# Patient Record
Sex: Male | Born: 1950 | Race: Black or African American | Hispanic: No | Marital: Single | Smoking: Never smoker
Health system: Southern US, Community
[De-identification: ages and names within clinical notes are randomized; demographics above are authoritative.]

## PROBLEM LIST (undated history)

## (undated) DIAGNOSIS — E785 Hyperlipidemia, unspecified: Secondary | ICD-10-CM

## (undated) DIAGNOSIS — I4891 Unspecified atrial fibrillation: Secondary | ICD-10-CM

## (undated) DIAGNOSIS — R569 Unspecified convulsions: Secondary | ICD-10-CM

## (undated) DIAGNOSIS — I639 Cerebral infarction, unspecified: Secondary | ICD-10-CM

## (undated) DIAGNOSIS — I10 Essential (primary) hypertension: Secondary | ICD-10-CM

---

## 2015-09-25 ENCOUNTER — Emergency Department: Payer: Medicare Other

## 2015-09-25 ENCOUNTER — Inpatient Hospital Stay
Admission: EM | Admit: 2015-09-25 | Discharge: 2015-10-02 | DRG: 871 | Disposition: A | Discharge status: Skilled Nursing Facility | Payer: Medicare Other | Attending: Internal Medicine | Admitting: Internal Medicine

## 2015-09-25 DIAGNOSIS — N179 Acute kidney failure, unspecified: Secondary | ICD-10-CM | POA: Diagnosis present

## 2015-09-25 DIAGNOSIS — Y92239 Unspecified place in hospital as the place of occurrence of the external cause: Secondary | ICD-10-CM | POA: Diagnosis not present

## 2015-09-25 DIAGNOSIS — R471 Dysarthria and anarthria: Secondary | ICD-10-CM | POA: Diagnosis present

## 2015-09-25 DIAGNOSIS — E872 Acidosis: Secondary | ICD-10-CM | POA: Diagnosis present

## 2015-09-25 DIAGNOSIS — R1310 Dysphagia, unspecified: Secondary | ICD-10-CM | POA: Diagnosis present

## 2015-09-25 DIAGNOSIS — A419 Sepsis, unspecified organism: Secondary | ICD-10-CM | POA: Diagnosis present

## 2015-09-25 DIAGNOSIS — B370 Candidal stomatitis: Secondary | ICD-10-CM | POA: Diagnosis not present

## 2015-09-25 DIAGNOSIS — T3695XA Adverse effect of unspecified systemic antibiotic, initial encounter: Secondary | ICD-10-CM | POA: Diagnosis not present

## 2015-09-25 DIAGNOSIS — R5600 Simple febrile convulsions: Secondary | ICD-10-CM | POA: Diagnosis present

## 2015-09-25 DIAGNOSIS — I482 Chronic atrial fibrillation: Secondary | ICD-10-CM | POA: Diagnosis present

## 2015-09-25 DIAGNOSIS — I69391 Dysphagia following cerebral infarction: Secondary | ICD-10-CM

## 2015-09-25 DIAGNOSIS — Y833 Surgical operation with formation of external stoma as the cause of abnormal reaction of the patient, or of later complication, without mention of misadventure at the time of the procedure: Secondary | ICD-10-CM | POA: Diagnosis not present

## 2015-09-25 DIAGNOSIS — Y95 Nosocomial condition: Secondary | ICD-10-CM | POA: Diagnosis present

## 2015-09-25 DIAGNOSIS — Z8249 Family history of ischemic heart disease and other diseases of the circulatory system: Secondary | ICD-10-CM | POA: Diagnosis not present

## 2015-09-25 DIAGNOSIS — I69320 Aphasia following cerebral infarction: Secondary | ICD-10-CM

## 2015-09-25 DIAGNOSIS — Z993 Dependence on wheelchair: Secondary | ICD-10-CM | POA: Diagnosis not present

## 2015-09-25 DIAGNOSIS — F0390 Unspecified dementia without behavioral disturbance: Secondary | ICD-10-CM | POA: Diagnosis present

## 2015-09-25 DIAGNOSIS — Z7901 Long term (current) use of anticoagulants: Secondary | ICD-10-CM

## 2015-09-25 DIAGNOSIS — J9601 Acute respiratory failure with hypoxia: Secondary | ICD-10-CM | POA: Diagnosis present

## 2015-09-25 DIAGNOSIS — J690 Pneumonitis due to inhalation of food and vomit: Secondary | ICD-10-CM | POA: Diagnosis present

## 2015-09-25 DIAGNOSIS — R6520 Severe sepsis without septic shock: Secondary | ICD-10-CM

## 2015-09-25 DIAGNOSIS — R310 Gross hematuria: Secondary | ICD-10-CM | POA: Diagnosis present

## 2015-09-25 DIAGNOSIS — K9423 Gastrostomy malfunction: Secondary | ICD-10-CM | POA: Diagnosis not present

## 2015-09-25 DIAGNOSIS — I248 Other forms of acute ischemic heart disease: Secondary | ICD-10-CM | POA: Diagnosis present

## 2015-09-25 DIAGNOSIS — I69354 Hemiplegia and hemiparesis following cerebral infarction affecting left non-dominant side: Secondary | ICD-10-CM

## 2015-09-25 DIAGNOSIS — J9602 Acute respiratory failure with hypercapnia: Secondary | ICD-10-CM | POA: Diagnosis present

## 2015-09-25 DIAGNOSIS — I10 Essential (primary) hypertension: Secondary | ICD-10-CM | POA: Diagnosis present

## 2015-09-25 DIAGNOSIS — E162 Hypoglycemia, unspecified: Secondary | ICD-10-CM | POA: Diagnosis not present

## 2015-09-25 DIAGNOSIS — E785 Hyperlipidemia, unspecified: Secondary | ICD-10-CM | POA: Diagnosis present

## 2015-09-25 DIAGNOSIS — Z79899 Other long term (current) drug therapy: Secondary | ICD-10-CM | POA: Diagnosis not present

## 2015-09-25 DIAGNOSIS — K521 Toxic gastroenteritis and colitis: Secondary | ICD-10-CM | POA: Diagnosis not present

## 2015-09-25 DIAGNOSIS — R569 Unspecified convulsions: Secondary | ICD-10-CM

## 2015-09-25 HISTORY — DX: Essential (primary) hypertension: I10

## 2015-09-25 HISTORY — DX: Unspecified atrial fibrillation: I48.91

## 2015-09-25 HISTORY — DX: Hyperlipidemia, unspecified: E78.5

## 2015-09-25 HISTORY — DX: Cerebral infarction, unspecified: I63.9

## 2015-09-25 LAB — LACTIC ACID, PLASMA
Lactic Acid, Venous: 3.5 mmol/L (ref 0.5–2.0)
Lactic Acid, Venous: 2.3 mmol/L (ref 0.5–2.0)

## 2015-09-25 LAB — URINALYSIS COMPLETE WITH MICROSCOPIC (ARMC ONLY)
BILIRUBIN URINE: NEGATIVE
Bacteria, UA: NONE SEEN
Bacteria, UA: NONE SEEN
GLUCOSE, UA: NEGATIVE mg/dL
HGB URINE DIPSTICK: NEGATIVE
KETONES UR: NEGATIVE mg/dL
LEUKOCYTES UA: NEGATIVE
Nitrite: NEGATIVE
Protein, ur: 100 mg/dL — AB
SPECIFIC GRAVITY, URINE: 1.017 (ref 1.005–1.030)
SQUAMOUS EPITHELIAL / LPF: NONE SEEN
Specific Gravity, Urine: 1.018 (ref 1.005–1.030)
pH: 5 (ref 5.0–8.0)

## 2015-09-25 LAB — COMPREHENSIVE METABOLIC PANEL
ALT: 37 U/L (ref 17–63)
AST: 53 U/L — AB (ref 15–41)
Albumin: 4.2 g/dL (ref 3.5–5.0)
Alkaline Phosphatase: 81 U/L (ref 38–126)
Anion gap: 13 (ref 5–15)
BUN: 20 mg/dL (ref 6–20)
CHLORIDE: 99 mmol/L — AB (ref 101–111)
CO2: 24 mmol/L (ref 22–32)
Calcium: 9.8 mg/dL (ref 8.9–10.3)
Creatinine, Ser: 1.79 mg/dL — ABNORMAL HIGH (ref 0.61–1.24)
GFR, EST AFRICAN AMERICAN: 43 mL/min — AB (ref >60)
GFR, EST NON AFRICAN AMERICAN: 38 mL/min — AB (ref >60)
Glucose, Bld: 85 mg/dL (ref 65–99)
POTASSIUM: 4 mmol/L (ref 3.5–5.1)
SODIUM: 136 mmol/L (ref 135–145)
Total Bilirubin: 1.2 mg/dL (ref 0.3–1.2)
Total Protein: 8.1 g/dL (ref 6.5–8.1)

## 2015-09-25 LAB — TROPONIN I
TROPONIN I: 0.72 ng/mL — AB (ref <0.031)
Troponin I: 0.57 ng/mL — ABNORMAL HIGH (ref <0.031)
Troponin I: 0.46 ng/mL — ABNORMAL HIGH (ref <0.031)

## 2015-09-25 LAB — CBC WITH DIFFERENTIAL/PLATELET
BASOS ABS: 0 10*3/uL (ref 0–0.1)
Basophils Relative: 0 %
EOS ABS: 0 10*3/uL (ref 0–0.7)
HCT: 41 % (ref 40.0–52.0)
Hemoglobin: 13.6 g/dL (ref 13.0–18.0)
Lymphs Abs: 1.1 10*3/uL (ref 1.0–3.6)
MCH: 29.3 pg (ref 26.0–34.0)
MCHC: 33.3 g/dL (ref 32.0–36.0)
MCV: 88.2 fL (ref 80.0–100.0)
MONO ABS: 1.1 10*3/uL — AB (ref 0.2–1.0)
Monocytes Relative: 5 %
Neutro Abs: 18.2 10*3/uL — ABNORMAL HIGH (ref 1.4–6.5)
Neutrophils Relative %: 89 %
PLATELETS: 185 10*3/uL (ref 150–440)
RBC: 4.65 MIL/uL (ref 4.40–5.90)
RDW: 14.9 % — AB (ref 11.5–14.5)
WBC: 20.5 10*3/uL — AB (ref 3.8–10.6)

## 2015-09-25 LAB — GLUCOSE, CAPILLARY
GLUCOSE-CAPILLARY: 140 mg/dL — AB (ref 65–99)
GLUCOSE-CAPILLARY: 85 mg/dL (ref 65–99)
GLUCOSE-CAPILLARY: 102 mg/dL — AB (ref 65–99)
Glucose-Capillary: 86 mg/dL (ref 65–99)

## 2015-09-25 LAB — CBC
HEMATOCRIT: 42.7 % (ref 40.0–52.0)
Hemoglobin: 14.3 g/dL (ref 13.0–18.0)
MCH: 29.2 pg (ref 26.0–34.0)
MCHC: 33.4 g/dL (ref 32.0–36.0)
MCV: 87.4 fL (ref 80.0–100.0)
PLATELETS: 126 10*3/uL — AB (ref 150–440)
RBC: 4.89 MIL/uL (ref 4.40–5.90)
RDW: 15.4 % — AB (ref 11.5–14.5)
WBC: 7.2 10*3/uL (ref 3.8–10.6)

## 2015-09-25 LAB — BLOOD GAS, ARTERIAL
Acid-base deficit: 3.7 mmol/L — ABNORMAL HIGH (ref 0.0–2.0)
Allens test (pass/fail): POSITIVE — AB
BICARBONATE: 24.2 meq/L (ref 21.0–28.0)
FIO2: 1
O2 Saturation: 95.2 %
PH ART: 7.26 — AB (ref 7.350–7.450)
PO2 ART: 88 mmHg (ref 83.0–108.0)
Patient temperature: 37
pCO2 arterial: 54 mmHg — ABNORMAL HIGH (ref 32.0–48.0)

## 2015-09-25 LAB — APTT: aPTT: 45 seconds — ABNORMAL HIGH (ref 24–36)

## 2015-09-25 LAB — PROTIME-INR
INR: 1.9
Prothrombin Time: 21.7 seconds — ABNORMAL HIGH (ref 11.4–15.0)

## 2015-09-25 LAB — MRSA PCR SCREENING: MRSA by PCR: NEGATIVE

## 2015-09-25 LAB — PROCALCITONIN: Procalcitonin: 47.44 ng/mL

## 2015-09-25 MED ORDER — SODIUM CHLORIDE 0.9 % IV SOLN
INTRAVENOUS | Status: DC
Start: 2015-09-25 — End: 2015-09-30
  Administered 2015-09-25 – 2015-09-29 (×8): via INTRAVENOUS

## 2015-09-25 MED ORDER — APIXABAN 5 MG PO TABS
2.5000 mg | ORAL_TABLET | Freq: Two times a day (BID) | ORAL | Status: DC
  Administered 2015-09-26 – 2015-09-30 (×9): 2.5 mg via ORAL
  Filled 2015-09-25 (×9): qty 1

## 2015-09-25 MED ORDER — BACLOFEN 10 MG PO TABS
10.0000 mg | ORAL_TABLET | Freq: Three times a day (TID) | ORAL | Status: DC
  Administered 2015-09-25 – 2015-10-02 (×21): 10 mg via ORAL
  Filled 2015-09-25 (×22): qty 1

## 2015-09-25 MED ORDER — ATORVASTATIN CALCIUM 20 MG PO TABS
40.0000 mg | ORAL_TABLET | Freq: Every day | ORAL | Status: DC
Start: 2015-09-25 — End: 2015-10-02
  Administered 2015-09-25 – 2015-10-01 (×7): 40 mg via ORAL
  Filled 2015-09-25 (×7): qty 2

## 2015-09-25 MED ORDER — PIPERACILLIN-TAZOBACTAM 3.375 G IVPB
3.3750 g | Freq: Three times a day (TID) | INTRAVENOUS | Status: DC
  Administered 2015-09-25 – 2015-10-01 (×18): 3.375 g via INTRAVENOUS
  Filled 2015-09-25 (×25): qty 50

## 2015-09-25 MED ORDER — OXYCODONE HCL 5 MG PO TABS: 5.0000 mg | ORAL_TABLET | ORAL | Status: DC | PRN

## 2015-09-25 MED ORDER — SODIUM CHLORIDE 0.9% FLUSH
3.0000 mL | Freq: Two times a day (BID) | INTRAVENOUS | Status: DC
  Administered 2015-09-25 – 2015-10-02 (×15): 3 mL via INTRAVENOUS

## 2015-09-25 MED ORDER — IPRATROPIUM-ALBUTEROL 0.5-2.5 (3) MG/3ML IN SOLN
3.0000 mL | RESPIRATORY_TRACT | Status: DC
  Administered 2015-09-25 – 2015-09-26 (×5): 3 mL via RESPIRATORY_TRACT
  Filled 2015-09-25 (×5): qty 3

## 2015-09-25 MED ORDER — SODIUM CHLORIDE 0.9 % IV BOLUS (SEPSIS)
1000.0000 mL | Freq: Once | INTRAVENOUS | Status: AC
  Administered 2015-09-25: 1000 mL via INTRAVENOUS

## 2015-09-25 MED ORDER — PIPERACILLIN-TAZOBACTAM 3.375 G IVPB
3.3750 g | Freq: Once | INTRAVENOUS | Status: AC
  Administered 2015-09-25: 3.375 g via INTRAVENOUS
  Filled 2015-09-25: qty 50

## 2015-09-25 MED ORDER — DEXTROSE 5 % IV SOLN
2.0000 g | Freq: Once | INTRAVENOUS | Status: AC
  Administered 2015-09-25: 2 g via INTRAVENOUS
  Filled 2015-09-25: qty 2

## 2015-09-25 MED ORDER — JEVITY 1.2 CAL PO LIQD
1000.0000 mL | ORAL | Status: DC
  Administered 2015-09-25 – 2015-09-26 (×2): 1000 mL
  Administered 2015-09-27: 06:00:00
  Administered 2015-09-29 – 2015-09-30 (×2): 1000 mL

## 2015-09-25 MED ORDER — SODIUM CHLORIDE 0.9 % IV SOLN
1000.0000 mg | Freq: Once | INTRAVENOUS | Status: AC
  Administered 2015-09-25: 1000 mg via INTRAVENOUS
  Filled 2015-09-25: qty 10

## 2015-09-25 MED ORDER — CETYLPYRIDINIUM CHLORIDE 0.05 % MT LIQD
7.0000 mL | Freq: Two times a day (BID) | OROMUCOSAL | Status: DC
  Administered 2015-09-25 – 2015-10-02 (×11): 7 mL via OROMUCOSAL

## 2015-09-25 MED ORDER — HEPARIN SODIUM (PORCINE) 5000 UNIT/ML IJ SOLN: 5000.0000 [IU] | Freq: Three times a day (TID) | INTRAMUSCULAR | Status: DC

## 2015-09-25 MED ORDER — ONDANSETRON HCL 4 MG PO TABS: 4.0000 mg | ORAL_TABLET | Freq: Four times a day (QID) | ORAL | Status: DC | PRN

## 2015-09-25 MED ORDER — VANCOMYCIN HCL IN DEXTROSE 1-5 GM/200ML-% IV SOLN: 1000.0000 mg | Freq: Once | INTRAVENOUS | Status: DC

## 2015-09-25 MED ORDER — DEXTROSE 5 % IV SOLN: 2.0000 g | Freq: Once | INTRAVENOUS | Status: DC

## 2015-09-25 MED ORDER — ACETAMINOPHEN 650 MG RE SUPP
650.0000 mg | Freq: Once | RECTAL | Status: AC
  Administered 2015-09-25: 650 mg via RECTAL
  Filled 2015-09-25: qty 1

## 2015-09-25 MED ORDER — INSULIN ASPART 100 UNIT/ML ~~LOC~~ SOLN
0.0000 [IU] | SUBCUTANEOUS | Status: DC
Start: 2015-09-25 — End: 2015-09-30
  Administered 2015-09-25 – 2015-09-28 (×8): 2 [IU] via SUBCUTANEOUS
  Filled 2015-09-25 (×8): qty 2

## 2015-09-25 MED ORDER — ONDANSETRON HCL 4 MG/2ML IJ SOLN: 4.0000 mg | Freq: Four times a day (QID) | INTRAMUSCULAR | Status: DC | PRN

## 2015-09-25 MED ORDER — OMEPRAZOLE 2 MG/ML ORAL SUSPENSION: 40.0000 mg | Freq: Two times a day (BID) | ORAL | Status: DC

## 2015-09-25 MED ORDER — ACETAMINOPHEN 650 MG RE SUPP: 650.0000 mg | Freq: Four times a day (QID) | RECTAL | Status: DC | PRN

## 2015-09-25 MED ORDER — CHLORHEXIDINE GLUCONATE 0.12 % MT SOLN
15.0000 mL | Freq: Two times a day (BID) | OROMUCOSAL | Status: DC
  Administered 2015-09-25 – 2015-10-02 (×14): 15 mL via OROMUCOSAL
  Filled 2015-09-25 (×9): qty 15

## 2015-09-25 MED ORDER — VANCOMYCIN HCL IN DEXTROSE 1-5 GM/200ML-% IV SOLN
1000.0000 mg | INTRAVENOUS | Status: DC
  Administered 2015-09-25: 1000 mg via INTRAVENOUS
  Filled 2015-09-25 (×2): qty 200

## 2015-09-25 MED ORDER — LORAZEPAM 2 MG/ML IJ SOLN
INTRAMUSCULAR | Status: AC
  Filled 2015-09-25: qty 1

## 2015-09-25 MED ORDER — PANTOPRAZOLE SODIUM 40 MG PO PACK
40.0000 mg | PACK | Freq: Two times a day (BID) | ORAL | Status: DC
  Administered 2015-09-25 – 2015-10-01 (×13): 40 mg via ORAL
  Filled 2015-09-25 (×17): qty 20

## 2015-09-25 MED ORDER — VANCOMYCIN HCL IN DEXTROSE 1-5 GM/200ML-% IV SOLN
1000.0000 mg | Freq: Once | INTRAVENOUS | Status: AC
  Administered 2015-09-25: 1000 mg via INTRAVENOUS
  Filled 2015-09-25: qty 200

## 2015-09-25 MED ORDER — ACETAMINOPHEN 325 MG PO TABS
650.0000 mg | ORAL_TABLET | Freq: Four times a day (QID) | ORAL | Status: DC | PRN
  Administered 2015-09-26 – 2015-09-29 (×3): 650 mg via ORAL
  Filled 2015-09-25 (×3): qty 2

## 2015-09-25 MED ORDER — VITAMIN D 1000 UNITS PO TABS
5000.0000 [IU] | ORAL_TABLET | Freq: Every day | ORAL | Status: DC
  Administered 2015-09-25 – 2015-10-02 (×8): 5000 [IU] via ORAL
  Filled 2015-09-25 (×7): qty 5

## 2015-09-25 MED ORDER — MORPHINE SULFATE (PF) 2 MG/ML IV SOLN: 2.0000 mg | INTRAVENOUS | Status: DC | PRN

## 2015-09-25 MED ORDER — FREE WATER
100.0000 mL | Freq: Three times a day (TID) | Status: DC
  Administered 2015-09-25 – 2015-10-02 (×22): 100 mL

## 2015-09-25 NOTE — [place] FL2 (Signed)
[place] MEDICAID FL2 LEVEL OF CARE SCREENING TOOL     IDENTIFICATION  Patient Name: Justin LippsMilton [name] Birthdate: [birth year] Sex: male Admission Date (Current Location): 09/25/2015  [place]   Facility and Address:  [facility], [address], [place], [place] [address]      Provider Number: [id number]  Attending Physician Name and Address:  Wyatt Hasteavid K [name], MD  Relative Name and Phone Number:       Current Level of Care: Hospital Recommended Level of Care: Skilled Nursing Facility Prior Approval Number:    Date Approved/Denied:   PASRR Number:  [id number]  Discharge Plan: SNF    Current Diagnoses: Patient Active Problem List   Diagnosis Date Noted  . Severe sepsis (HCC) 09/25/2015    Orientation RESPIRATION BLADDER Height & Weight     Self  O2 Incontinent Weight: 145 lb (65.772 kg) Height:  5\' 8"  (172.7 cm)  BEHAVIORAL SYMPTOMS/MOOD NEUROLOGICAL BOWEL NUTRITION STATUS    Convulsions/Seizures Incontinent Diet (Pureed diet with thicken)  AMBULATORY STATUS COMMUNICATION OF NEEDS Skin   Extensive Assist Verbally Normal                       Personal Care Assistance Level of Assistance  Bathing, Feeding, Dressing, Total care Bathing Assistance: Maximum assistance Feeding assistance: Maximum assistance Dressing Assistance: Maximum assistance Total Care Assistance: Maximum assistance   Functional Limitations Info  Sight, Hearing, Speech Sight Info: Adequate Hearing Info: Adequate Speech Info: Impaired    SPECIAL CARE FACTORS FREQUENCY  Restorative feeding program           Restorative Feeding Program Frequency: he gets fed by the tube        Contractures      Additional Factors Info  Code Status Code Status Info: full             Current Medications (09/25/2015):  This is the current hospital active medication list Current Facility-Administered Medications  Medication Dose Route Frequency  Provider Last Rate Last Dose  . 0.9 %  sodium chloride infusion   Intravenous Continuous Wyatt Hasteavid K [name], MD 125 mL/hr at 09/25/15 1341    . acetaminophen (TYLENOL) tablet 650 mg  650 mg Oral Q6H PRN Wyatt Hasteavid K [name], MD       Or  . acetaminophen (TYLENOL) suppository 650 mg  650 mg Rectal Q6H PRN Wyatt Hasteavid K [name], MD      . antiseptic oral rinse (CPC / CETYLPYRIDINIUM CHLORIDE 0.05%) solution 7 mL  7 mL Mouth Rinse q12n4p Wyatt Hasteavid K [name], MD      . apixaban [facility](ELIQUIS) tablet 2.5 mg  2.5 mg Oral Q12H Wyatt Hasteavid K [name], MD      . atorvastatin (LIPITOR) tablet 40 mg  40 mg Oral QHS Wyatt Hasteavid K [name], MD      . baclofen (LIORESAL) tablet 10 mg  10 mg Oral TID Wyatt Hasteavid K [name], MD      . chlorhexidine (PERIDEX) 0.12 % solution 15 mL  15 mL Mouth Rinse BID Wyatt Hasteavid K [name], MD   15 mL at 09/25/15 1444  . cholecalciferol (VITAMIN D) tablet 5,000 Units  5,000 Units Oral Daily Wyatt Hasteavid K [name], MD      . feeding supplement (JEVITY 1.2 CAL) liquid 1,000 mL  1,000 mL Per Tube Continuous Shane CrutchPradeep Ramachandran, MD      . free water 100 mL  100 mL Per Tube Q8H Shane CrutchPradeep Ramachandran, MD      . insulin aspart (  novoLOG) injection 0-24 Units  0-24 Units Subcutaneous Q4H Shane Crutch, MD      . ipratropium-albuterol (DUONEB) 0.5-2.5 (3) MG/3ML nebulizer solution 3 mL  3 mL Nebulization Q4H Wyatt Haste, MD   3 mL at 09/25/15 1315  . morphine 2 MG/ML injection 2 mg  2 mg Intravenous Q4H PRN Wyatt Haste, MD      . ondansetron [facility]) tablet 4 mg  4 mg Oral Q6H PRN Wyatt Haste, MD       Or  . ondansetron [facility]) injection 4 mg  4 mg Intravenous Q6H PRN Wyatt Haste, MD      . oxyCODONE (Oxy IR/ROXICODONE) immediate release tablet 5 mg  5 mg Oral Q4H PRN Wyatt Haste, MD      . pantoprazole sodium (PROTONIX) 40 mg/20 mL oral suspension 40 mg  40 mg Oral [address], RPH   40 mg at 09/25/15 1444  . piperacillin-tazobactam (ZOSYN) IVPB 3.375 g  3.375 g Intravenous Q8H Jodelle Red Swayne, [facility]      . sodium chloride flush (NS) 0.9  % injection 3 mL  3 mL Intravenous Q12H Wyatt Haste, MD   3 mL at 09/25/15 1106  . vancomycin (VANCOCIN) IVPB 1000 mg/200 mL premix  1,000 mg Intravenous Q24H Cindi Carbon, [facility]         Discharge Medications: Please see discharge summary for a list of discharge medications.  Relevant Imaging Results:  Relevant Lab Results:   Additional Information  SSN [id number] [place], [place]

## 2015-09-25 NOTE — Progress Notes (Signed)
Pt weaned off NRB to 3L [place]- tolerating well

## 2015-09-25 NOTE — H&P (Signed)
[facility] [place] [facility]   PATIENT NAME: Justin LippsMilton [name]    MR#:  [id number]  DATE OF BIRTH:  07/11/1950   DATE OF ADMISSION:  09/25/2015  PRIMARY CARE PHYSICIAN: Dimple CaseySean A Smith, MD   REQUESTING/REFERRING PHYSICIAN: Inocencio HomesGayle  CHIEF COMPLAINT:   Chief Complaint  Patient presents with  . Seizures    HISTORY OF PRESENT ILLNESS:  Justin [name]  is a 65 y.o. male with a known history of Prior strokes with residual left-sided weakness, wheelchair bound at baseline, dysphagia, dysarthria at baseline who is presenting from his nursing facility [facility] after having witnessed seizure activity. The patient is unable to provide any meaningful information given mental status and medical condition history obtained from ER staff as well as family member present at bedside. She states the patient had 1-2 day duration of cough, nonproductive but otherwise no further symptoms. At baseline the patient is on pured diet with thickened liquids, he also has tube feeds for nutrition. Day of admission he supposedly had tonic clonic full body shaking EMS was called and gave 4 doses of Versed prior to arriving to the hospital. Since ER visit no further seizure activity. Of note patient had documented temperature of 106 at [facility] prior to seizure activity. Noted desaturation requiring nonrebreather in the ER. Code sepsis initiated Once again patient unable to provide a meaningful information given mental status medical condition  PAST MEDICAL HISTORY:   Past Medical History  Diagnosis Date  . Hypertension   . Atrial fibrillation (HCC)   . Stroke (HCC)   . Hyperlipidemia     PAST SURGICAL HISTORY:  History reviewed. No pertinent past surgical history.  SOCIAL HISTORY:   Social History  Substance Use Topics  . Smoking status: Never Smoker   . Smokeless tobacco: Not on file  . Alcohol Use: No    FAMILY HISTORY:   Family History  Problem Relation Age of  Onset  . Hypertension Other     DRUG ALLERGIES:  Allergies no known allergies  REVIEW OF SYSTEMS:  Unable to obtain given patient's mental status/medical condition   MEDICATIONS AT HOME:   Prior to Admission medications   Medication Sig Start Date End Date Taking? Authorizing Provider  acetaminophen (TYLENOL) 325 MG tablet 650 mg by Gastric Tube route every 4 (four) hours as needed for mild pain or moderate pain.   Yes Historical Provider, MD  apixaban (ELIQUIS) 2.5 MG TABS tablet Take 2.5 mg by mouth every 12 (twelve) hours.   Yes Historical Provider, MD  atorvastatin (LIPITOR) 40 MG tablet Take 40 mg by mouth at bedtime.   Yes Historical Provider, MD  baclofen (LIORESAL) 10 MG tablet Take 10 mg by mouth 3 (three) times daily.   Yes Historical Provider, MD  Cholecalciferol (VITAMIN D3) 5000 units TABS 5,000 Units by Gastric Tube route daily.   Yes Historical Provider, MD  Menthol, Topical Analgesic, (BIOFREEZE) 4 % GEL Apply 1 application topically 2 (two) times daily. Apply to left shoulder and elbow for arthritis.   Yes Historical Provider, MD  metoprolol tartrate (LOPRESSOR) 25 MG tablet Take 12.5 mg by mouth 2 (two) times daily.   Yes Historical Provider, MD  Multiple Vitamins-Minerals (MULTIVITAMIN WITH MINERALS) tablet Take 1 tablet by mouth daily.   Yes Historical Provider, MD  omeprazole (PRILOSEC) 2 mg/mL SUSP Take 40 mg by mouth every 12 (twelve) hours.   Yes Historical Provider, MD  traMADol (ULTRAM) 50 MG tablet 50 mg by PEG Tube  route 2 (two) times daily.   Yes Historical Provider, MD      VITAL SIGNS:  Blood pressure 112/69, pulse 111, temperature 102.7 F (39.3 C), temperature source Rectal, resp. rate 20, height  (1.727 m), weight 145 lb (65.772 kg), SpO2 99 %.  PHYSICAL EXAMINATION:   VITAL SIGNS: Filed Vitals:   09/25/15 1030 09/25/15 1045  BP: 114/79   Pulse:  101  Temp: 97.8 F (36.6 C) 97.3 F (36.3 C)  Resp:  21   GENERAL:65 y.o.male critically  ill  HEAD: Normocephalic, atraumatic.  EYES: Pupils equal, round, reactive to light. Unable to assess extraocular muscles given mental status/medical condition. No scleral icterus.  MOUTH: Moist mucosal membrane. Dentition intact. No abscess noted.  EAR, NOSE, THROAT: Clear without exudates. No external lesions.  NECK: Supple. No thyromegaly. No nodules. No JVD.  PULMONARY: Diffuse coarse breath sounds with scattered rhonchi without wheeze tachypneic No use of accessory muscles, poor respiratory effort. Poor air entry bilaterally CHEST: Nontender to palpation.  CARDIOVASCULAR: S1 and S2. Tachycardic. No murmurs, rubs, or gallops. No edema. Pedal pulses 2+ bilaterally.  GASTROINTESTINAL: Soft, nontender, nondistended. No masses. Positive bowel sounds. No hepatosplenomegaly.  MUSCULOSKELETAL: Left upper extremity contractures most prominent in hand, No swelling, clubbing, or edema. Passive Range of motion full in all extremities.  NEUROLOGIC: Unable to assess given mental status/medical condition SKIN: No ulceration, lesions, rashes, or cyanosis. Skin warm and dry. Turgor intact.  PSYCHIATRIC: Unable to assess given mental status/medical condition     LABORATORY PANEL:   CBC  Recent Labs Lab 09/25/15 0817  WBC 20.5*  HGB 13.6  HCT 41.0  PLT 185   ------------------------------------------------------------------------------------------------------------------  Chemistries   Recent Labs Lab 09/25/15 0817  NA 136  K 4.0  CL 99*  CO2 24  GLUCOSE 85  BUN 20  CREATININE 1.79*  CALCIUM 9.8  AST 53*  ALT 37  ALKPHOS 81  BILITOT 1.2   ------------------------------------------------------------------------------------------------------------------  Cardiac Enzymes  Recent Labs Lab 09/25/15 0817  TROPONINI 0.72*   ------------------------------------------------------------------------------------------------------------------  RADIOLOGY:  Ct Head Wo  Contrast  09/25/2015  CLINICAL DATA:  Seizure like activity EXAM: CT HEAD WITHOUT CONTRAST TECHNIQUE: Contiguous axial images were obtained from the base of the skull through the vertex without intravenous contrast. COMPARISON:  None FINDINGS: Evidence of multiple chronic bilateral cortical infarcts are noted including right frontal lobe posterior left frontal lobe and left occipital lobe. Low attenuation throughout the subcortical and periventricular white matter is identified compatible with chronic microvascular disease. Ex vacuo dilatation of the sulci and ventricles noted. There is no abnormal extra-axial fluid collection, intracranial hemorrhage or mass. The paranasal sinuses and mastoid air cells are clear. The calvarium is intact. IMPRESSION: 1. No acute intracranial abnormalities. 2. Chronic bilateral cortical infarcts, small vessel ischemic change and brain atrophy. Electronically Signed   By: Signa Kell M.D.   On: 09/25/2015 08:46   Dg Chest Port 1 View  09/25/2015  CLINICAL DATA:  Seizure. EXAM: PORTABLE CHEST 1 VIEW COMPARISON:  None. FINDINGS: There is cardiomegaly. Area consolidation noted in the left lower lobe and possible early infiltrate in the right base as well compatible with pneumonia. No effusions. No acute bony abnormality. IMPRESSION: Bilateral lower lobe airspace opacities, left greater than right. Favor multifocal pneumonia. Electronically Signed   By: Charlett Nose M.D.   On: 09/25/2015 09:24    EKG:   Orders placed or performed during the hospital encounter of 09/25/15  . ED EKG  . ED EKG  IMPRESSION AND PLAN:   65 year old African-American gentleman history of CVA residual left-sided weakness, aphasia, dysphagia presenting after witnessed seizure activity with temperature 106    1 severe.Sepsis, meeting septic criteria by leukocytosis, temperature, heart rate, respiratory rate, lactic acidosis, troponin present on arrival. Source healthcare associated pneumonia  Code sepsis initiated. Panculture. Broad-spectrum antibiotics including vancomycin/Zosyn and taper antibiotics when culture data returns.  Has received a 30 mL/kg IV fluid bolus. Continue IV fluid hydration to keep mean arterial pressure greater than 65. may require pressor therapy if blood pressure worsens. We will repeat lactic acid if the initial is greater than 2.2.    2. Acute hypoxic respiratory failure secondary to above: Supplemental oxygen desaturates to mid 80s on 6 L nasal cannula-currently requiring nonrebreather, add breathing treatments  3. Elevated troponin: Likely in the setting of demand ischemia, telemetry, trend cardiac enzymes 4. Questionable seizure activity: Received Keppra in emergency department 5. Atrial fibrillation chronic: apixaban 6. Hyperlipidemia statin therapy 5. Dietary: Nothing by mouth given mental status will consult dietary for restarting tube feeds as long as blood pressure remained stable 6. Venous thromboembolism prophylactic: apixaban   All the records are reviewed and case discussed with ED provider. Management plans discussed with the patient, family and they are in agreement.  CODE STATUS: Full code as discussed with family at bedside  TOTAL TIME TAKING CARE OF THIS PATIENT: Critical care 61 minutes .    Roby Spalla,  Mardi Mainland.D on 09/25/2015 at 10:47 AM  Between 7am to 6pm - Pager - [phone]  After 6pm: House Pager: - [phone]  [facility] [place] [facility]  Office  [phone]  CC: Primary care physician; Dimple Casey, MD

## 2015-09-25 NOTE — Progress Notes (Signed)
2100: Pt. Had 45mL of pink urine, urine @ 2000 was yellow/clear. Paged MD Anne HahnWillis discussed current labs, suggested PT/INR---> MD ordered follow up UA to start and look at results before additional lab work. Discussed eliquis scheduled- will hold for now.

## 2015-09-25 NOTE — ED Notes (Signed)
Pt O2 sats on 4L [place] 83%, increased  to 6L..Marland Kitchen

## 2015-09-25 NOTE — ED Notes (Signed)
Pt comes into the ED via EMS from [facility] with c/o pt having seizure like activity, EMS reports pt having a total of 8 seizures this morning, states they gave the pt a total of 4mg  versed. Pt is nonresponsive and diaphoretic on arrival.

## 2015-09-25 NOTE — Consult Note (Signed)
[facility] Critical Care Medicine Consultation     ASSESSMENT/PLAN     PULMONARY A:*Pneumonia, suspect aspiration with sepsis. Acute respiratory failure secondary to above. Chest x-ray images from 6/3 reviewed, consistent with right lower lobe infiltrate. Acute respiratory acidosis. Sepsis, initial lactic acid 3.5, subsequently improved to 2.3. P:   Continue broad-spectrum antibiotics. Follow results of pro-calcitonin  CARDIOVASCULAR A: Mild elevation of troponin. Likely demand ischemia. Sinus tachycardia, likely secondary to sepsis. P:  Will monitor.  RENAL A:  Acute kidney injury P:   IV fluids.  GASTROINTESTINAL A:  Will restart PEG tube feeding. Discussed with the dietary.  HEMATOLOGIC A:  Leukocytosis, likely stress related. P:  Will monitor.  INFECTIOUS A:  Pneumonia with sepsis. P:   Continue antibiotics, de-escalate as results of cultures are available.  Micro/culture results:  BCx2 6/3: Pending   Antibiotics: Pro-calcitonin 6/3:47.44.  ENDOCRINE A:  Will monitor blood glucose  NEUROLOGIC A: Baseline dementia. Will monitor mental status. P:     MAJOR EVENTS/TEST RESULTS:   Best Practices  DVT Prophylaxis: Apixaban GI Prophylaxis: Protonix   ---------------------------------------  ---------------------------------------   Name: Myles LippsMilton Korol MRN: [id number] DOB: Dec 26, 1950    ADMISSION DATE:  09/25/2015 CONSULTATION DATE: 09/25/15  REFERRING MD :  Dr. Clint GuyHower  CHIEF COMPLAINT:  Dyspnea   HISTORY OF PRESENT ILLNESS:   The patient is a 65 year old nursing home resident with a history of hypertension, CVA with left-sided hemi-paresis, aphasia and dementia. The patient has baseline confusion, therefore, all history is obtained from the chart, staff, as well as the patient's sister was at the bedside. She tells me that the patient is normally in good spirits and talkative, he gets around in a wheelchair at his nursing home. She said  she regularly feeds him and has not noticed any problems with swallowing. He eats some of his food as pure, and is also receiving nutrition via PEG tube. Over the last few days they've noted that the patient has been coughing. He had been having a seizure, which was noted to be "107.1". On EMS arrival he was actively seizing andaccording to EMS he had "8 seizures back-to-back". This resolved after they gave him 4 mg of Versed.  In the ER, the patient was put on 100% nonrebreather, it was noted that he had a significant lactic acidosis of 3, initial blood gas showed hypercapnic respiratory failure. He was subsequently admitted to the ICU, since that time, his respiratory status since come down, he initially had sinus tachycardia which is now improved. His FiO2 has been weaned from a nonrebreather mask to 3 L nasal cannula. He is been started on IV Zosyn.   PAST MEDICAL HISTORY :  Past Medical History  Diagnosis Date  . Hypertension   . Atrial fibrillation (HCC)   . Stroke (HCC)   . Hyperlipidemia    History reviewed. No pertinent past surgical history. Prior to Admission medications   Medication Sig Start Date End Date Taking? Authorizing Provider  acetaminophen (TYLENOL) 325 MG tablet 650 mg by Gastric Tube route every 4 (four) hours as needed for mild pain or moderate pain.   Yes Historical Provider, MD  apixaban (ELIQUIS) 2.5 MG TABS tablet Take 2.5 mg by mouth every 12 (twelve) hours.   Yes Historical Provider, MD  atorvastatin (LIPITOR) 40 MG tablet Take 40 mg by mouth at bedtime.   Yes Historical Provider, MD  baclofen (LIORESAL) 10 MG tablet Take 10 mg by mouth 3 (three) times daily.   Yes Historical Provider,  MD  Cholecalciferol (VITAMIN D3) 5000 units TABS 5,000 Units by Gastric Tube route daily.   Yes Historical Provider, MD  Menthol, Topical Analgesic, (BIOFREEZE) 4 % GEL Apply 1 application topically 2 (two) times daily. Apply to left shoulder and elbow for arthritis.   Yes  Historical Provider, MD  metoprolol tartrate (LOPRESSOR) 25 MG tablet Take 12.5 mg by mouth 2 (two) times daily.   Yes Historical Provider, MD  Multiple Vitamins-Minerals (MULTIVITAMIN WITH MINERALS) tablet Take 1 tablet by mouth daily.   Yes Historical Provider, MD  omeprazole (PRILOSEC) 2 mg/mL SUSP Take 40 mg by mouth every 12 (twelve) hours.   Yes Historical Provider, MD  traMADol (ULTRAM) 50 MG tablet 50 mg by PEG Tube route 2 (two) times daily.   Yes Historical Provider, MD   No Known Allergies  FAMILY HISTORY:  Family History  Problem Relation Age of Onset  . Hypertension Other    SOCIAL HISTORY:  reports that he has never smoked. He does not have any smokeless tobacco history on file. He reports that he does not drink alcohol. His drug history is not on file.  REVIEW OF SYSTEMS:   Cannot provide a review of systems due to her aphasia   VITAL SIGNS: Temp:  [97.2 F (36.2 C)-102.7 F (39.3 C)] 97.7 F (36.5 C) (06/03 1400) Pulse Rate:  [93-145] 93 (06/03 1145) Resp:  [18-28] 20 (06/03 1400) BP: (103-116)/(68-86) 113/86 mmHg (06/03 1400) SpO2:  [86 %-100 %] 100 % (06/03 1400) Weight:  [145 lb (65.772 kg)] 145 lb (65.772 kg) (06/03 0824) HEMODYNAMICS:   VENTILATOR SETTINGS:   INTAKE / OUTPUT: No intake or output data in the 24 hours ending 09/25/15 1455  Physical Examination:   VS: BP 113/86 mmHg  Pulse 93  Temp(Src) 97.7 F (36.5 C) (Rectal)  Resp 20  Ht 5\' 8"  (1.727 m)  Wt 145 lb (65.772 kg)  BMI 22.05 kg/m2  SpO2 100%  General Appearance: No distress  Neuro:mental status, reduced, speech , abnormal HEENT: PERRLA, EOM intact,  Pulmonary: normal breath sounds., diaphragmatic excursion normal. CardiovascularNormal S1,S2.  No m/r/g.    Abdomen: Benign, Soft, non-tender, No masses, hepatosplenomegaly, No lymphadenopathy Renal:  No costovertebral tenderness  GU:  Not performed at this time. Endoc: No evident thyromegaly, no signs of acromegaly. Skin:   warm,  no rashes, no ecchymosis  Extremities: normal, no cyanosis, clubbing, no edema, warm with normal capillary refill.    LABS: Reviewed   LABORATORY PANEL:   CBC  Recent Labs Lab 09/25/15 0817  WBC 20.5*  HGB 13.6  HCT 41.0  PLT 185    Chemistries   Recent Labs Lab 09/25/15 0817  NA 136  K 4.0  CL 99*  CO2 24  GLUCOSE 85  BUN 20  CREATININE 1.79*  CALCIUM 9.8  AST 53*  ALT 37  ALKPHOS 81  BILITOT 1.2     Recent Labs Lab 09/25/15 0826  GLUCAP 86    Recent Labs Lab 09/25/15 1101  PHART 7.26*  PCO2ART 54*  PO2ART 88    Recent Labs Lab 09/25/15 0817  AST 53*  ALT 37  ALKPHOS 81  BILITOT 1.2  ALBUMIN 4.2    Cardiac Enzymes  Recent Labs Lab 09/25/15 1307  TROPONINI 0.57*    RADIOLOGY:  Ct Head Wo Contrast  09/25/2015  CLINICAL DATA:  Seizure like activity EXAM: CT HEAD WITHOUT CONTRAST TECHNIQUE: Contiguous axial images were obtained from the base of the skull through the vertex without intravenous contrast.  COMPARISON:  None FINDINGS: Evidence of multiple chronic bilateral cortical infarcts are noted including right frontal lobe posterior left frontal lobe and left occipital lobe. Low attenuation throughout the subcortical and periventricular white matter is identified compatible with chronic microvascular disease. Ex vacuo dilatation of the sulci and ventricles noted. There is no abnormal extra-axial fluid collection, intracranial hemorrhage or mass. The paranasal sinuses and mastoid air cells are clear. The calvarium is intact. IMPRESSION: 1. No acute intracranial abnormalities. 2. Chronic bilateral cortical infarcts, small vessel ischemic change and brain atrophy. Electronically Signed   By: Signa Kell M.D.   On: 09/25/2015 08:46   Dg Chest Port 1 View  09/25/2015  CLINICAL DATA:  Seizure. EXAM: PORTABLE CHEST 1 VIEW COMPARISON:  None. FINDINGS: There is cardiomegaly. Area consolidation noted in the left lower lobe and possible early  infiltrate in the right base as well compatible with pneumonia. No effusions. No acute bony abnormality. IMPRESSION: Bilateral lower lobe airspace opacities, left greater than right. Favor multifocal pneumonia. Electronically Signed   By: Charlett Nose M.D.   On: 09/25/2015 09:24       --Wells Guiles, MD.  Board Certified in Internal Medicine, Pulmonary Medicine, Critical Care Medicine, and Sleep Medicine.  ICU Pager [phone] [facility] Office Number: [phone]  Santiago Glad, M.D.  Stephanie Acre, M.D.  Billy Fischer, M.D   09/25/2015, 2:55 PM   Critical Care Attestation.  I have personally obtained a history, examined the patient, evaluated laboratory and imaging results, formulated the assessment and plan and placed orders. The Patient requires high complexity decision making for assessment and support, frequent evaluation and titration of therapies, application of advanced monitoring technologies and extensive interpretation of multiple databases. The patient has critical illness that could lead imminently to failure of 1 or more organ systems and requires the highest level of physician preparedness to intervene.  Critical Care Time devoted to patient care services described in this note is 35 minutes and is exclusive of time spent in procedures.

## 2015-09-25 NOTE — ED Provider Notes (Signed)
[facility] Emergency Department Provider Note   ____________________________________________  Time seen: Approximately 8:13 AM  I have reviewed the triage vital signs and the nursing notes.   HISTORY  Chief Complaint Seizures  Caveat-history of present illness and review of systems is limited due to the patient's severity of illness. Information is obtained from his sister who is at bedside as well as staff at [facility] health care.  HPI Justin [name] is a 65 y.o. male with history of hypertension, history of CVA with aphasia and left-sided deficits, hyperlipidemia who presents for evaluation of multiple seizures today. According to staff at his living facility, he had had "seizures on and off" throughout the morning. He has no history of seizures. His temperature was noted to be "107.1" according to nurse at the facility, he did not receive any medications for antipyresis. On EMS arrival he was actively seizing andaccording to EMS he had "8 seizures back-to-back". This resolved after they gave him 4 mg of Versed. No other history is available at this time.   Past Medical History  Diagnosis Date  . Hypertension   . Atrial fibrillation (HCC)   . Stroke (HCC)   . Hyperlipidemia     Patient Active Problem List   Diagnosis Date Noted  . Severe sepsis (HCC) 09/25/2015    History reviewed. No pertinent past surgical history.  Current Outpatient Rx  Name  Route  Sig  Dispense  Refill  . acetaminophen (TYLENOL) 325 MG tablet   Gastric Tube   650 mg by Gastric Tube route every 4 (four) hours as needed for mild pain or moderate pain.         Marland Kitchen apixaban (ELIQUIS) 2.5 MG TABS tablet   Oral   Take 2.5 mg by mouth every 12 (twelve) hours.         Marland Kitchen atorvastatin (LIPITOR) 40 MG tablet   Oral   Take 40 mg by mouth at bedtime.         . baclofen (LIORESAL) 10 MG tablet   Oral   Take 10 mg by mouth 3 (three) times daily.         . Cholecalciferol  (VITAMIN D3) 5000 units TABS   Gastric Tube   5,000 Units by Gastric Tube route daily.         . Menthol, Topical Analgesic, (BIOFREEZE) 4 % GEL   Apply externally   Apply 1 application topically 2 (two) times daily. Apply to left shoulder and elbow for arthritis.         . metoprolol tartrate (LOPRESSOR) 25 MG tablet   Oral   Take 12.5 mg by mouth 2 (two) times daily.         . Multiple Vitamins-Minerals (MULTIVITAMIN WITH MINERALS) tablet   Oral   Take 1 tablet by mouth daily.         Marland Kitchen omeprazole (PRILOSEC) 2 mg/mL SUSP   Oral   Take 40 mg by mouth every 12 (twelve) hours.         . traMADol (ULTRAM) 50 MG tablet   PEG Tube   50 mg by PEG Tube route 2 (two) times daily.           Allergies Review of patient's allergies indicates no known allergies.  Family History  Problem Relation Age of Onset  . Hypertension Other     Social History Social History  Substance Use Topics  . Smoking status: Never Smoker   . Smokeless tobacco: None  .  Alcohol Use: No    Review of Systems Constitutional: +fever  Caveat-history of present illness and review of systems is limited due to the patient's severity of illness. Information is obtained from his sister who is at bedside as well as staff at [facility] health care. ____________________________________________   PHYSICAL EXAM:  Filed Vitals:   09/25/15 1015 09/25/15 1030 09/25/15 1045 09/25/15 1100  BP:  114/79  116/78  Pulse: 108  101 99  Temp: 98.2 F (36.8 C) 97.8 F (36.6 C) 97.3 F (36.3 C) 97.3 F (36.3 C)  TempSrc:      Resp:   21 25  Height:      Weight:      SpO2: 100%  86% 93%     Constitutional: Lethargic, diaphoretic, no response to pain in the extremities but attempts to close eyes tightly when I attempt to open them. Eyes: Conjunctivae are normal. PERRL. EOMI. Head: Atraumatic. Nose: No congestion/rhinnorhea. Mouth/Throat: Mucous membranes are moist.  Oropharynx  non-erythematous. Neck: No stridor.  No cervical spine tenderness to palpation. Cardiovascular: Tachycardoc rate, regular rhythm. Grossly normal heart sounds.  Good peripheral circulation. Respiratory: Mild tachypnea, diminished breath sounds throughout all lung fields. Gastrointestinal: Soft and nontender. No distention.  No CVA tenderness. Genitourinary: deferred Musculoskeletal: No lower extremity tenderness nor edema.  No joint effusions. Neurologic: Solmnolent, no response to pain in the extremities but grimaces and closes eyes tightly. No verbalization. Skin:  Skin is warm, dry and intact. No rash noted. Psychiatric: Mood and affect are normal. Speech and behavior are normal.  ____________________________________________   LABS (all labs ordered are listed, but only abnormal results are displayed)  Labs Reviewed  CBC WITH DIFFERENTIAL/PLATELET - Abnormal; Notable for the following:    WBC 20.5 (*)    RDW 14.9 (*)    Neutro Abs 18.2 (*)    Monocytes Absolute 1.1 (*)    All other components within normal limits  COMPREHENSIVE METABOLIC PANEL - Abnormal; Notable for the following:    Chloride 99 (*)    Creatinine, Ser 1.79 (*)    AST 53 (*)    GFR calc non Af Amer 38 (*)    GFR calc Af Amer 43 (*)    All other components within normal limits  TROPONIN I - Abnormal; Notable for the following:    Troponin I 0.72 (*)    All other components within normal limits  URINALYSIS COMPLETEWITH MICROSCOPIC ([facility] ONLY) - Abnormal; Notable for the following:    Color, Urine AMBER (*)    APPearance HAZY (*)    Protein, ur 100 (*)    Squamous Epithelial / LPF 0-5 (*)    All other components within normal limits  LACTIC ACID, PLASMA - Abnormal; Notable for the following:    Lactic Acid, Venous 3.5 (*)    All other components within normal limits  CULTURE, BLOOD (ROUTINE X 2)  CULTURE, BLOOD (ROUTINE X 2)  MRSA PCR SCREENING  GLUCOSE, CAPILLARY  LACTIC ACID, PLASMA  BLOOD GAS,  ARTERIAL   ____________________________________________  EKG  ED ECG REPORT I, Gayla DossGayle, Eilah Common A, the attending physician, personally viewed and interpreted this ECG.   Date: 09/25/2015  EKG Time: 08:48  Rate: 134  Rhythm: sinus tachycardia  Axis: nromal  Intervals:none  ST&T Change: No acute ST elevation. Prolonged QTC.  ____________________________________________  RADIOLOGY  CT head IMPRESSION: 1. No acute intracranial abnormalities. 2. Chronic bilateral cortical infarcts, small vessel ischemic change and brain atrophy.   CXR IMPRESSION: Bilateral lower lobe  airspace opacities, left greater than right. Favor multifocal pneumonia. ____________________________________________   PROCEDURES  Procedure(s) performed: None  Critical Care performed: Yes, see critical care note(s). Total critical care time spent 40 minutes.   CRITICAL CARE Performed by: Toney Rakes A   Total critical care time: 40 minutes  Critical care time was exclusive of separately billable procedures and treating other patients.  Critical care was necessary to treat or prevent imminent or life-threatening deterioration.  Critical care was time spent personally by me on the following activities: development of treatment plan with patient and/or surrogate as well as nursing, discussions with consultants, evaluation of patient's response to treatment, examination of patient, obtaining history from patient or surrogate, ordering and performing treatments and interventions, ordering and review of laboratory studies, ordering and review of radiographic studies, pulse oximetry and re-evaluation of patient's condition. ____________________________________________   INITIAL IMPRESSION / ASSESSMENT AND PLAN / ED COURSE  Pertinent labs & imaging results that were available during my care of the patient were reviewed by me and considered in my medical decision making (see chart for details).  Powell Halbert is a 65 y.o. male with history of hypertension, history of CVA with aphasia and left-sided deficits, hyperlipidemia who presents for evaluation of multiple seizures today in the setting of fever. No history of seizures. On arrival to the emergency department he closes his eyes tightly to pain but will not respond to pain in any extremity. He is diaphoretic, tachycardic, febrile, meeting multiple surgeries criteria. CODE STATUS is initiated, we'll give IV vancomycin, Zosyn, ceftriaxone given first-time seizures, unknown source and concerned that this could represent meningitis. We'll give liberal IV fluids and anticipate admission. We'll give antipyretics.  ----------------------------------------- 10:04 AM on 09/25/2015 ----------------------------------------- Patient with improvement of his vital signs at this time with the above treatment. He now opens eyes to noxious stimuli, still will not follow commands but is responding to noxious stimuli in the extremities. I reviewed his labs. His white blood cell count is elevated at 20,000. CMP with mild elevation at 1.79, lactic acid elevated at 3.5. Troponin elevated 0.72, EKG not consistent with STEMI, question NSTEMI versus demand ischemia. CT head shows no acute intracranial process. Chest x-ray shows bilateral lower airspace opacities, favoring multifocal pneumonia. His sister provides additional history and reports that he has been coughing since yesterday. He has not had any recurrent seizure activity since arrival to the emergency department. I suspect his altered mental status at this point is more so related to having received 4 mg of Versed. Given the additional history of cough yesterday, multifocal pneumonia, I suspect this is the most likely cause of his fever and likely caused complex febrile seizures. I think meningitis is less likely in the setting of multifocal pneumonia so I will not perform an LP at this time. I discussed the case with the  hospitalist, Dr. Clint Guy, for admission at this time.  ____________________________________________   FINAL CLINICAL IMPRESSION(S) / ED DIAGNOSES  Final diagnoses:  Seizures, generalized convulsive (HCC)  Sepsis, due to unspecified organism (HCC)  Febrile seizure (HCC)      NEW MEDICATIONS STARTED DURING THIS VISIT:  New Prescriptions   No medications on file     Note:  This document was prepared using Dragon voice recognition software and may include unintentional dictation errors.    Gayla Doss, MD 09/25/15 [phone]

## 2015-09-25 NOTE — Clinical Social Work Note (Signed)
Clinical Social Work Assessment  Patient Details  Name: Justin [name] MRN: [id number] Date of Birth: May 28, 1950  Date of referral:  09/25/15               Reason for consult:  Facility Placement                Permission sought to share information with:  Family Supports, Customer service manager Permission granted to share information::  Yes, Verbal Permission Granted  Name::     [name] [phone]  Agency::  [facility]  Relationship::     Contact Information:     Housing/Transportation Living arrangements for the past 2 months:  [place] of Information:  Facility, Other (Comment Required) (Sister) Patient Interpreter Needed:  None Criminal Activity/Legal Involvement Pertinent to Current Situation/Hospitalization:  No - Comment as needed Significant Relationships:  Siblings Lives with:  Facility Resident Do you feel safe going back to the place where you live?  Yes Need for family participation in patient care:  Yes (Comment)  Care giving concerns:  Sister concerned about recent seizure activitiy   Social Worker assessment / plan: LCSW met with patient and his sister. Patient was unresponsive during our assessment. She reported that her brother resides at [facility] and lives and shares a room with his brother. Patient has a prior stroke which left him for wheel chair bound and weak on left side. Patients diet is pureed and thicken is used. Patient also is tube fed for nutrition. Patient never married but has 2 daughters. He requires full assistance with all ADL's and is incontinent. In talking with his sister  The patient  worked on a horse farm for 20 years. He has UHC,Medicare and Medicaid.   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  Patient/Family's Understanding of and Emotional Response to Diagnosis, Current Treatment, and Prognosis: Continued medical work up  Emotional Assessment Appearance:  Appears stated age Attitude/Demeanor/Rapport:  Unable to Assess Affect (typically observed):  Unable to Assess Orientation:  Oriented to Self, Oriented to Place, Oriented to  Time, Oriented to Situation Alcohol / Substance use:  Not Applicable Psych involvement (Current and /or in the community):  No (Comment)  Discharge Needs  Concerns to be addressed:  No discharge needs identified Readmission within the last 30 days:  No Current discharge risk:  None Barriers to Discharge:  Continued Medical Work up   [place], [place], LCSW 09/25/2015, 3:30 PM

## 2015-09-25 NOTE — ED Notes (Signed)
Admitting MD at the bedside speaking with pt sister about plan of care, NRB removed and [place] @ 4L applied, will watch pt.

## 2015-09-25 NOTE — Progress Notes (Signed)
Initial Nutrition Assessment  DOCUMENTATION CODES:   Not applicable  INTERVENTION:   Spoke with Dr. Nicholos Johnsamachandran regarding nutrition poc.  Will initiate TF at this time via PEG as Dr. Nicholos Johnsamachandran agreeable. Currently pt is NPO without SLP consultation.  Will recommend Jevity 1.2 at 3965mL/hr to provide 1872kcals and 87g protein to meet 100% of nutritional needs. Will recommend 100mL TID free water flushes as TF provides 1264mL fluid, and will adjust when IVF d/c.  Will continue to follow and assess.   NUTRITION DIAGNOSIS:   Inadequate oral intake related to inability to eat as evidenced by NPO status.  GOAL:   Patient will meet greater than or equal to 90% of their needs  MONITOR:   PO intake, TF tolerance, Weight trends, Labs, I & O's  REASON FOR ASSESSMENT:   Consult Enteral/tube feeding initiation and management  ASSESSMENT:   Pt admitted with possible aspiration pna with sepsis.  Past Medical History  Diagnosis Date  . Hypertension   . Atrial fibrillation (HCC)   . Stroke (HCC)   . Hyperlipidemia      Diet Order:  Diet NPO time specified Except for: Sips with Meds    Food/Nutrition-Related History: Per paper chart, pt was receiving TwoCal HN at 7080mL/hr for 9hour, nighttime feed with 125mL q4hours of free water at [facility] with Pureed foods and Nectar thick liquids at baseline. Twocal regimen would have provided roughly 1440kcals and 60g protein.  Medications: NS at 15125mL/hr, Cholecalciferol, Protonix Labs: reviewed, Na wdl, K wdl, glucose 85   Gastrointestinal Profile: Last BM:  09/25/2015   Nutrition-Focused Physical Exam Findings:  RD attempted physical exam however unable to complete Nutrition-Focused physical exam at this time.    Weight Change: No weight trend per [facility] weight encounters   Skin:  Reviewed, no issues   Height:   Ht Readings from Last 1 Encounters:  09/25/15 5\' 8"  (1.727 m)    Weight:   Wt Readings from Last 1  Encounters:  09/25/15 145 lb (65.772 kg)     BMI:  Body mass index is 22.05 kg/(m^2).  Estimated Nutritional Needs:   Kcal:  1850-2200kcals  Protein:  80-99g protein  Fluid:  >/= 1.8L fluid  EDUCATION NEEDS:   Education needs no appropriate at this time  Leda QuailAllyson Raneshia Derick, RD, LDN Pager [phone] Weekend/On-Call Pager [phone]

## 2015-09-25 NOTE — ED Notes (Signed)
Pt O2 sats 85% on 6L [place], pt placed back on NRB. MD notified..Marland Kitchen

## 2015-09-25 NOTE — Progress Notes (Signed)
Pharmacy Antibiotic Note  Justin [name] is a 65 y.o. male admitted on 09/25/2015 with sepsis.  Pharmacy has been consulted for vancomycin and piperacillin/tazobactam dosing.  Plan: Piperacillin/tazobactam 3.375 g IV q8h EI  Patient received vancomycin 1000 mg one-time dose in ED @ 1000 this morning. Will order vancomycin 1000 mg IV q24h to begin @ 1800 this evening giving an 8 hour stacked dose. Goal vancomycin trough 15-20 mcg/mL Vancomycin trough ordered for 6/6 @ 1730 which is prior to the 5th dose and should represent steady state.  SCr ordered with AM labs tomorrow - renal function should be closely monitored.  Height: 5\' 8"  (172.7 cm) Weight: 145 lb (65.772 kg) IBW/kg (Calculated) : 68.4  Temp (24hrs), Avg:98 F (36.7 C), Min:97.2 F (36.2 C), Max:102.7 F (39.3 C)   Recent Labs Lab 09/25/15 0817 09/25/15 0818 09/25/15 1131  WBC 20.5*  --   --   CREATININE 1.79*  --   --   LATICACIDVEN  --  3.5* 2.3*    Estimated Creatinine Clearance: 37.3 mL/min (by C-G formula based on Cr of 1.79).    Allergies no known allergies  Antimicrobials this admission: vancomycin 6/3 >>  Piperacillin/tazobactam 6/3 >>   Dose adjustments this admission:  Microbiology results: 6/3 BCx: Sent 6/3 MRSA PCR: Sent  Thank you for allowing pharmacy to be a part of this patient's care.  Cindi CarbonMary M Alexsandro Salek, PharmD Clinical Pharmacist 09/25/2015 1:18 PM

## 2015-09-26 ENCOUNTER — Inpatient Hospital Stay: Payer: Medicare Other

## 2015-09-26 ENCOUNTER — Encounter: Payer: Self-pay | Admitting: Radiology

## 2015-09-26 DIAGNOSIS — Z7901 Long term (current) use of anticoagulants: Secondary | ICD-10-CM

## 2015-09-26 DIAGNOSIS — R310 Gross hematuria: Secondary | ICD-10-CM

## 2015-09-26 LAB — TROPONIN I: TROPONIN I: 0.43 ng/mL — AB (ref <0.031)

## 2015-09-26 LAB — PHOSPHORUS: PHOSPHORUS: 2.6 mg/dL (ref 2.5–4.6)

## 2015-09-26 LAB — CBC
HEMATOCRIT: 41.3 % (ref 40.0–52.0)
Hemoglobin: 13.8 g/dL (ref 13.0–18.0)
MCH: 29.5 pg (ref 26.0–34.0)
MCHC: 33.5 g/dL (ref 32.0–36.0)
MCV: 88.1 fL (ref 80.0–100.0)
Platelets: 123 10*3/uL — ABNORMAL LOW (ref 150–440)
RBC: 4.69 MIL/uL (ref 4.40–5.90)
RDW: 15 % — AB (ref 11.5–14.5)
WBC: 8.4 10*3/uL (ref 3.8–10.6)

## 2015-09-26 LAB — GLUCOSE, CAPILLARY
GLUCOSE-CAPILLARY: 133 mg/dL — AB (ref 65–99)
GLUCOSE-CAPILLARY: 104 mg/dL — AB (ref 65–99)
GLUCOSE-CAPILLARY: 137 mg/dL — AB (ref 65–99)
Glucose-Capillary: 104 mg/dL — ABNORMAL HIGH (ref 65–99)
Glucose-Capillary: 114 mg/dL — ABNORMAL HIGH (ref 65–99)
Glucose-Capillary: 89 mg/dL (ref 65–99)

## 2015-09-26 LAB — BASIC METABOLIC PANEL
Anion gap: 12 (ref 5–15)
BUN: 15 mg/dL (ref 6–20)
CALCIUM: 8.6 mg/dL — AB (ref 8.9–10.3)
CO2: 24 mmol/L (ref 22–32)
CREATININE: 1 mg/dL (ref 0.61–1.24)
Chloride: 102 mmol/L (ref 101–111)
GFR calc non Af Amer: >60 mL/min (ref >60)
GLUCOSE: 122 mg/dL — AB (ref 65–99)
Potassium: 3.4 mmol/L — ABNORMAL LOW (ref 3.5–5.1)
Sodium: 138 mmol/L (ref 135–145)

## 2015-09-26 LAB — MAGNESIUM: Magnesium: 1.9 mg/dL (ref 1.7–2.4)

## 2015-09-26 LAB — PROCALCITONIN: PROCALCITONIN: 52.31 ng/mL

## 2015-09-26 MED ORDER — IOPAMIDOL (ISOVUE-300) INJECTION 61%
125.0000 mL | Freq: Once | INTRAVENOUS | Status: AC | PRN
  Administered 2015-09-26: 125 mL via INTRAVENOUS

## 2015-09-26 MED ORDER — IPRATROPIUM-ALBUTEROL 0.5-2.5 (3) MG/3ML IN SOLN: 3.0000 mL | RESPIRATORY_TRACT | Status: DC | PRN

## 2015-09-26 MED ORDER — POTASSIUM CHLORIDE 20 MEQ/15ML (10%) PO SOLN
40.0000 meq | Freq: Once | ORAL | Status: AC
  Administered 2015-09-26: 40 meq
  Filled 2015-09-26: qty 30

## 2015-09-26 NOTE — Progress Notes (Signed)
Discussed lab results w/ Dr. Anne HahnWillis, PT and APTT slightly elevated, urololgy consult placed.  If increased bleeding occurs will notify MD.

## 2015-09-26 NOTE — Progress Notes (Signed)
[facility] - [place] at [facility]   PATIENT NAME: Justin [name]    MRN#:  [id number]  DATE OF BIRTH:  1951-04-24  SUBJECTIVE:  Hospital Day: 1 day Justin [name] is a 65 y.o. male presenting with Seizures .   Overnight events: Issue with hematuria overnight urology consult placed Interval Events: More alert this morning closer to baseline  REVIEW OF SYSTEMS:  Unable to obtain given baseline mental status   DRUG ALLERGIES:  No Known Allergies  VITALS:  Blood pressure 130/78, pulse 94, temperature 99.7 F (37.6 C), temperature source Core (Comment), resp. rate 24, height  (1.727 m), weight 149 lb 14.6 oz (68 kg), SpO2 99 %.  PHYSICAL EXAMINATION:  VITAL SIGNS: Filed Vitals:   09/26/15 1000 09/26/15 1100  BP: 126/67 130/78  Pulse: 89 94  Temp: 99.5 F (37.5 C) 99.7 F (37.6 C)  Resp: 26 24   GENERAL:65 y.o.male currently in Minimal acute distress.  HEAD: Normocephalic, atraumatic.  EYES: Pupils equal, round, reactive to light. Extraocular muscles intact. No scleral icterus.  MOUTH: Dry mucosal membrane. Dentition intact. No abscess noted.  EAR, NOSE, THROAT: Clear without exudates. No external lesions.  NECK: Supple. No thyromegaly. No nodules. No JVD.  PULMONARY: Coarse rhonchi throughout without wheeze rails . No use of accessory muscles, Good respiratory effort. good air entry bilaterally CHEST: Nontender to palpation.  CARDIOVASCULAR: S1 and S2. Regular rate and rhythm. No murmurs, rubs, or gallops. No edema. Pedal pulses 2+ bilaterally.  GASTROINTESTINAL: Soft, nontender, nondistended. No masses. Positive bowel sounds. No hepatosplenomegaly.  MUSCULOSKELETAL: No swelling, clubbing, or edema. Passive Range of motion full in all extremities.  NEUROLOGIC: Unable to fully assess given patient's mental status  SKIN: No ulceration, lesions, rashes, or cyanosis. Skin warm and dry. Turgor intact.  PSYCHIATRIC: Mood, affect flat The patient is  awake, seems non-verbal at this time      LABORATORY PANEL:   CBC  Recent Labs Lab 09/26/15 0111  WBC 8.4  HGB 13.8  HCT 41.3  PLT 123*   ------------------------------------------------------------------------------------------------------------------  Chemistries   Recent Labs Lab 09/25/15 0817 09/26/15 0111  NA 136 138  K 4.0 3.4*  CL 99* 102  CO2 24 24  GLUCOSE 85 122*  BUN 20 15  CREATININE 1.79* 1.00  CALCIUM 9.8 8.6*  MG  --  1.9  AST 53*  --   ALT 37  --   ALKPHOS 81  --   BILITOT 1.2  --    ------------------------------------------------------------------------------------------------------------------  Cardiac Enzymes  Recent Labs Lab 09/26/15 0111  TROPONINI 0.43*   ------------------------------------------------------------------------------------------------------------------  RADIOLOGY:  Ct Head Wo Contrast  09/25/2015  CLINICAL DATA:  Seizure like activity EXAM: CT HEAD WITHOUT CONTRAST TECHNIQUE: Contiguous axial images were obtained from the base of the skull through the vertex without intravenous contrast. COMPARISON:  None FINDINGS: Evidence of multiple chronic bilateral cortical infarcts are noted including right frontal lobe posterior left frontal lobe and left occipital lobe. Low attenuation throughout the subcortical and periventricular white matter is identified compatible with chronic microvascular disease. Ex vacuo dilatation of the sulci and ventricles noted. There is no abnormal extra-axial fluid collection, intracranial hemorrhage or mass. The paranasal sinuses and mastoid air cells are clear. The calvarium is intact. IMPRESSION: 1. No acute intracranial abnormalities. 2. Chronic bilateral cortical infarcts, small vessel ischemic change and brain atrophy. Electronically Signed   By: Signa Kell M.D.   On: 09/25/2015 08:46   Dg Chest Port 1 View  09/25/2015  CLINICAL DATA:  Seizure. EXAM: PORTABLE CHEST 1 VIEW COMPARISON:  None.  FINDINGS: There is cardiomegaly. Area consolidation noted in the left lower lobe and possible early infiltrate in the right base as well compatible with pneumonia. No effusions. No acute bony abnormality. IMPRESSION: Bilateral lower lobe airspace opacities, left greater than right. Favor multifocal pneumonia. Electronically Signed   By: Charlett NoseKevin  Dover M.D.   On: 09/25/2015 09:24    EKG:   Orders placed or performed during the hospital encounter of 09/25/15  . ED EKG  . ED EKG    ASSESSMENT AND PLAN:   Justin [name] is a 65 y.o. male presenting with Seizures . Admitted 09/25/2015 : Day #: 1 day 1. Severe sepsis secondary to healthcare associated pneumonia: Continued improvement continue antibiotics can discontinue vancomycin given MRSA PCR negative, continue supplemental oxygen as required, breathing treatments 2. Elevated troponin: Stabilized secondary to demand 3.Hematuria urology pending 4. Dietary: Tube feeds   All the records are reviewed and case discussed with Care Management/Social Workerr. Management plans discussed with the patient, family and they are in agreement.  CODE STATUS: full TOTAL TIME TAKING CARE OF THIS PATIENT: 28 minutes.   POSSIBLE D/C IN 2-3DAYS, DEPENDING ON CLINICAL CONDITION.   Marian Meneely,  Mardi MainlandDavid K M.D on 09/26/2015 at 12:37 PM  Between 7am to 6pm - Pager - [phone]  After 6pm: House Pager: [phone]  [facility]io Ne[facility]  Office  [phone]  CC: Primary care physician; Dimple CaseySean A Smith, MD

## 2015-09-26 NOTE — Consult Note (Addendum)
Consult: Gross hematuria Requested by: Dr. Nicholos Johnsamachandran  History of Present Illness: 65 year old African-American male admitted with seizure-like activity. These found to have pneumonia and has chronic A. fib as well. Is on anticoagulation. He is stable now and is transferred to the floor but he developed gross hematuria last night. UA showed no bacteria. No urine cx sent. He is on Zosyn. He has residual issues from a stroke. He has a G-tube. He does not communicate and is nonambulatory per nursing. I'm not certain if he came with the Foley or if it was placed while he was here. He does not provide any history.    Past Medical History  Diagnosis Date  . Hypertension   . Atrial fibrillation (HCC)   . Stroke (HCC)   . Hyperlipidemia    History reviewed. No pertinent past surgical history.  Home Medications:  Prescriptions prior to admission  Medication Sig Dispense Refill Last Dose  . acetaminophen (TYLENOL) 325 MG tablet 650 mg by Gastric Tube route every 4 (four) hours as needed for mild pain or moderate pain.   unknown  . apixaban (ELIQUIS) 2.5 MG TABS tablet Take 2.5 mg by mouth every 12 (twelve) hours.   unknown  . atorvastatin (LIPITOR) 40 MG tablet Take 40 mg by mouth at bedtime.   unknown  . baclofen (LIORESAL) 10 MG tablet Take 10 mg by mouth 3 (three) times daily.   unknown  . Cholecalciferol (VITAMIN D3) 5000 units TABS 5,000 Units by Gastric Tube route daily.   unknown  . Menthol, Topical Analgesic, (BIOFREEZE) 4 % GEL Apply 1 application topically 2 (two) times daily. Apply to left shoulder and elbow for arthritis.   unknown  . metoprolol tartrate (LOPRESSOR) 25 MG tablet Take 12.5 mg by mouth 2 (two) times daily.   unknown  . Multiple Vitamins-Minerals (MULTIVITAMIN WITH MINERALS) tablet Take 1 tablet by mouth daily.   unknown  . omeprazole (PRILOSEC) 2 mg/mL SUSP Take 40 mg by mouth every 12 (twelve) hours.   unknown  . traMADol (ULTRAM) 50 MG tablet 50 mg by PEG Tube route 2  (two) times daily.   unknown   Allergies: No Known Allergies  Family History  Problem Relation Age of Onset  . Hypertension Other    Social History:  reports that he has never smoked. He does not have any smokeless tobacco history on file. He reports that he does not drink alcohol. His drug history is not on file.  ROS: A complete review of systems was performed.  All systems are negative except for pertinent findings as noted. ROS   Physical Exam:  Vital signs in last 24 hours: Temp:  [98.6 F (37 C)-100.6 F (38.1 C)] 100.6 F (38.1 C) (06/04 1540) Pulse Rate:  [60-97] 96 (06/04 1540) Resp:  [15-31] 20 (06/04 1540) BP: (100-144)/(63-88) 144/78 mmHg (06/04 1540) SpO2:  [95 %-100 %] 100 % (06/04 1540) Weight:  [68 kg (149 lb 14.6 oz)] 68 kg (149 lb 14.6 oz) (06/04 0424) General:  Alert and oriented, No acute distress HEENT: Normocephalic, atraumatic Neck: No JVD or lymphadenopathy Cardiovascular: Regular rate and rhythm Lungs: Regular rate and effort Abdomen: Soft, nontender, nondistended, no abdominal masses, g-tube in place Extremities: No edema Neurologic: Grossly intact GU: Foley catheter in place, urine red in tubing but hematuria thin/watery without clots.  Laboratory Data:  Results for orders placed or performed during the hospital encounter of 09/25/15 (from the past 24 hour(s))  Troponin I (q 6hr x 3)  Status: Abnormal   Collection Time: 09/25/15  7:04 PM  Result Value Ref Range   Troponin I 0.46 (H) <0.031 ng/mL  Glucose, capillary     Status: None   Collection Time: 09/25/15  7:44 PM  Result Value Ref Range   Glucose-Capillary 85 65 - 99 mg/dL   Comment 1 Notify RN   Urinalysis complete, with microscopic ([facility] only)     Status: Abnormal   Collection Time: 09/25/15  9:48 PM  Result Value Ref Range   Color, Urine RED (A) YELLOW   APPearance CLOUDY (A) CLEAR   Glucose, UA (A) NEGATIVE mg/dL    TEST NOT REPORTED DUE TO COLOR INTERFERENCE OF URINE PIGMENT    Bilirubin Urine (A) NEGATIVE    TEST NOT REPORTED DUE TO COLOR INTERFERENCE OF URINE PIGMENT   Ketones, ur (A) NEGATIVE mg/dL    TEST NOT REPORTED DUE TO COLOR INTERFERENCE OF URINE PIGMENT   Specific Gravity, Urine 1.018 1.005 - 1.030   Hgb urine dipstick (A) NEGATIVE    TEST NOT REPORTED DUE TO COLOR INTERFERENCE OF URINE PIGMENT   pH  5.0 - 8.0    TEST NOT REPORTED DUE TO COLOR INTERFERENCE OF URINE PIGMENT   Protein, ur (A) NEGATIVE mg/dL    TEST NOT REPORTED DUE TO COLOR INTERFERENCE OF URINE PIGMENT   Nitrite (A) NEGATIVE    TEST NOT REPORTED DUE TO COLOR INTERFERENCE OF URINE PIGMENT   Leukocytes, UA (A) NEGATIVE    TEST NOT REPORTED DUE TO COLOR INTERFERENCE OF URINE PIGMENT   RBC / HPF TOO NUMEROUS TO COUNT 0 - 5 RBC/hpf   WBC, UA 6-30 0 - 5 WBC/hpf   Bacteria, UA NONE SEEN NONE SEEN   Squamous Epithelial / LPF NONE SEEN NONE SEEN  APTT     Status: Abnormal   Collection Time: 09/25/15 10:39 PM  Result Value Ref Range   aPTT 45 (H) 24 - 36 seconds  Protime-INR     Status: Abnormal   Collection Time: 09/25/15 10:39 PM  Result Value Ref Range   Prothrombin Time 21.7 (H) 11.4 - 15.0 seconds   INR 1.90   CBC     Status: Abnormal   Collection Time: 09/25/15 10:39 PM  Result Value Ref Range   WBC 7.2 3.8 - 10.6 K/uL   RBC 4.89 4.40 - 5.90 MIL/uL   Hemoglobin 14.3 13.0 - 18.0 g/dL   HCT 16.1 09.6 - 04.5 %   MCV 87.4 80.0 - 100.0 fL   MCH 29.2 26.0 - 34.0 pg   MCHC 33.4 32.0 - 36.0 g/dL   RDW 40.9 (H) 81.1 - 91.4 %   Platelets 126 (L) 150 - 440 K/uL  Glucose, capillary     Status: Abnormal   Collection Time: 09/25/15 11:26 PM  Result Value Ref Range   Glucose-Capillary 102 (H) 65 - 99 mg/dL   Comment 1 Notify RN   Troponin I (q 6hr x 3)     Status: Abnormal   Collection Time: 09/26/15  1:11 AM  Result Value Ref Range   Troponin I 0.43 (H) <0.031 ng/mL  Procalcitonin     Status: None   Collection Time: 09/26/15  1:11 AM  Result Value Ref Range   Procalcitonin  52.31 ng/mL  CBC     Status: Abnormal   Collection Time: 09/26/15  1:11 AM  Result Value Ref Range   WBC 8.4 3.8 - 10.6 K/uL   RBC 4.69 4.40 - 5.90 MIL/uL  Hemoglobin 13.8 13.0 - 18.0 g/dL   HCT 29.5 62.1 - 30.8 %   MCV 88.1 80.0 - 100.0 fL   MCH 29.5 26.0 - 34.0 pg   MCHC 33.5 32.0 - 36.0 g/dL   RDW 65.7 (H) 84.6 - 96.2 %   Platelets 123 (L) 150 - 440 K/uL  Basic metabolic panel     Status: Abnormal   Collection Time: 09/26/15  1:11 AM  Result Value Ref Range   Sodium 138 135 - 145 mmol/L   Potassium 3.4 (L) 3.5 - 5.1 mmol/L   Chloride 102 101 - 111 mmol/L   CO2 24 22 - 32 mmol/L   Glucose, Bld 122 (H) 65 - 99 mg/dL   BUN 15 6 - 20 mg/dL   Creatinine, Ser 9.52 0.61 - 1.24 mg/dL   Calcium 8.6 (L) 8.9 - 10.3 mg/dL   GFR calc non Af Amer >60 >60 mL/min   GFR calc Af Amer >60 >60 mL/min   Anion gap 12 5 - 15  Magnesium     Status: None   Collection Time: 09/26/15  1:11 AM  Result Value Ref Range   Magnesium 1.9 1.7 - 2.4 mg/dL  Phosphorus     Status: None   Collection Time: 09/26/15  1:11 AM  Result Value Ref Range   Phosphorus 2.6 2.5 - 4.6 mg/dL  Glucose, capillary     Status: Abnormal   Collection Time: 09/26/15  3:34 AM  Result Value Ref Range   Glucose-Capillary 104 (H) 65 - 99 mg/dL   Comment 1 Notify RN   Glucose, capillary     Status: Abnormal   Collection Time: 09/26/15  7:08 AM  Result Value Ref Range   Glucose-Capillary 114 (H) 65 - 99 mg/dL  Glucose, capillary     Status: Abnormal   Collection Time: 09/26/15 11:43 AM  Result Value Ref Range   Glucose-Capillary 133 (H) 65 - 99 mg/dL  Glucose, capillary     Status: Abnormal   Collection Time: 09/26/15  3:42 PM  Result Value Ref Range   Glucose-Capillary 104 (H) 65 - 99 mg/dL   Recent Results (from the past 240 hour(s))  Blood culture (routine x 2)     Status: None (Preliminary result)   Collection Time: 09/25/15  8:17 AM  Result Value Ref Range Status   Specimen Description BLOOD RIGHT ARM  Final    Special Requests BOTTLES DRAWN AEROBIC AND ANAEROBIC  10CC  Final   Culture NO GROWTH 1 DAY  Final   Report Status PENDING  Incomplete  Blood culture (routine x 2)     Status: None (Preliminary result)   Collection Time: 09/25/15  9:35 AM  Result Value Ref Range Status   Specimen Description BLOOD RIGHT ARM  Final   Special Requests BOTTLES DRAWN AEROBIC AND ANAEROBIC  3CC  Final   Culture NO GROWTH 1 DAY  Final   Report Status PENDING  Incomplete  MRSA PCR Screening     Status: None   Collection Time: 09/25/15  1:30 PM  Result Value Ref Range Status   MRSA by PCR NEGATIVE NEGATIVE Final    Comment:        The GeneXpert MRSA Assay (FDA approved for NASAL specimens only), is one component of a comprehensive MRSA colonization surveillance program. It is not intended to diagnose MRSA infection nor to guide or monitor treatment for MRSA infections.    Creatinine:  Recent Labs  09/25/15 0817 09/26/15 0111  CREATININE 1.79*  1.00    Impression/Assessment/plan: Gross hematuria - will assess kidneys and bladder with CT hematuria protocol. Patient may need in or outpatient cystoscopy. Will follow.  Abbiegail Landgren 09/26/2015, 4:30 PM

## 2015-09-26 NOTE — Progress Notes (Signed)
Pharmacy Antibiotic Note  Justin LippsMilton Krueger is a 65 y.o. male admitted on 09/25/2015 with sepsis/PNA(?aspiration).  Pharmacy has been consulted for piperacillin/tazobactam dosing. Vanc d/c 6/4.  Plan: Piperacillin/tazobactam 3.375 g IV q8h EI   Height: 5\' 8"  (172.7 cm) Weight: 149 lb 14.6 oz (68 kg) IBW/kg (Calculated) : 68.4  Temp (24hrs), Avg:99.1 F (37.3 C), Min:97.9 F (36.6 C), Max:99.9 F (37.7 C)   Recent Labs Lab 09/25/15 0817 09/25/15 0818 09/25/15 1131 09/25/15 2239 09/26/15 0111  WBC 20.5*  --   --  7.2 8.4  CREATININE 1.79*  --   --   --  1.00  LATICACIDVEN  --  3.5* 2.3*  --   --     Estimated Creatinine Clearance: 68.9 mL/min (by C-G formula based on Cr of 1).    No Known Allergies  Antimicrobials this admission: vancomycin 6/3 >> 6/4 Piperacillin/tazobactam 6/3 >>   Dose adjustments this admission:  Microbiology results: 6/3 BCx: Sent 6/3 MRSA PCR: negative CXR RLL infiltrate  Thank you for allowing pharmacy to be a part of this patient's care.  Angelique BlonderMerrill,Bunnie Lederman A, PharmD Clinical Pharmacist 09/26/2015 2:25 PM

## 2015-09-26 NOTE — Progress Notes (Signed)
Alerted Dr. Sheryle Hailiamond of K+ level, 40 mEq ordered

## 2015-09-26 NOTE — Consult Note (Signed)
[facility] [place] Critical [facility] Medicine Consultation     ASSESSMENT/PLAN   65 yo male NH resident, admitted with pneumonia, suspect aspiration.   PULMONARY A:*Pneumonia, suspect aspiration with sepsis. Acute respiratory failure secondary to above. Chest x-ray images from 6/3  consistent with right lower lobe infiltrate. Acute respiratory acidosis. Sepsis, initial lactic acid 3.5, subsequently improved to 2.3. P:   Continue broad-spectrum antibiotics. Follow results of pro-calcitonin  CARDIOVASCULAR A: Mild elevation of troponin. Likely demand ischemia. Sinus tachycardia, likely secondary to sepsis, improved.  P:  Will monitor.  RENAL A:  Acute kidney injury-resolving.  P:   IV fluids.  GASTROINTESTINAL A:  Will restart PEG tube feeding. Discussed with dietary. Will need swallow eval, per RN pt did not appear to do well with bedside eval.    HEMATOLOGIC A:  Leukocytosis, likely stress related-better. P:  Will monitor.  INFECTIOUS A:  Pneumonia with sepsis. P:   Continue antibiotics, de-escalate as results of cultures are available.  Micro/culture results: MRSA screen negative.  BCx2 6/3: negative thus far.  Pro-calcitonin 6/3:47.44.>> 6/4 52.31  Antibiotics: Zosyn 6/3>> Vancomycin 6/3>>6/4  ENDOCRINE A:  Will monitor blood glucose  NEUROLOGIC A: Baseline dementia. Will monitor mental status. P:     MAJOR EVENTS/TEST RESULTS:   Best Practices  DVT Prophylaxis: Apixaban GI Prophylaxis: Protonix   ---------------------------------------  ---------------------------------------   Name: Justin [name] MRN: [id number] DOB: 1951-04-24    ADMISSION DATE:  09/25/2015   Interim history: Patient appears comfortable this morning, no particular complaints.  REVIEW OF SYSTEMS:   Cannot provide a review of systems due to aphasia   VITAL SIGNS: Temp:  [97.2 F (36.2 C)-99.5 F (37.5 C)] 99.3 F (37.4 C) (06/04 0900) Pulse Rate:  [60-116] 92 (06/04  0900) Resp:  [15-31] 25 (06/04 0900) BP: (100-126)/(65-88) 126/70 mmHg (06/04 0900) SpO2:  [86 %-100 %] 97 % (06/04 0900) Weight:  [149 lb 14.6 oz (68 kg)] 149 lb 14.6 oz (68 kg) (06/04 0424) HEMODYNAMICS: INTAKE / OUTPUT:  Intake/Output Summary (Last 24 hours) at 09/26/15 0919 Last data filed at 09/26/15 0900  Gross per 24 hour  Intake 4183.41 ml  Output   1380 ml  Net 2803.41 ml    Physical Examination:   VS: BP 126/70 mmHg  Pulse 92  Temp(Src) 99.3 F (37.4 C) (Core (Comment))  Resp 25  Ht  (1.727 m)  Wt 149 lb 14.6 oz (68 kg)  BMI 22.80 kg/m2  SpO2 97%  General Appearance: No distress  Neuro:mental status, reduced, speech  abnormal HEENT: PERRLA, EOM intact,  Pulmonary: normal breath sounds., diaphragmatic excursion normal. CardiovascularNormal S1,S2.  No m/r/g.    Abdomen: Benign, Soft, non-tender, No masses, hepatosplenomegaly, No lymphadenopathy Renal:  No costovertebral tenderness  GU:  Not performed at this time. Endoc: No evident thyromegaly, no signs of acromegaly. Skin:   warm, no rashes, no ecchymosis  Extremities: normal, no cyanosis, clubbing, no edema, warm with normal capillary refill.    LABS: Reviewed   LABORATORY PANEL:   CBC  Recent Labs Lab 09/26/15 0111  WBC 8.4  HGB 13.8  HCT 41.3  PLT 123*    Chemistries   Recent Labs Lab 09/25/15 0817 09/26/15 0111  NA 136 138  K 4.0 3.4*  CL 99* 102  CO2 24 24  GLUCOSE 85 122*  BUN 20 15  CREATININE 1.79* 1.00  CALCIUM 9.8 8.6*  MG  --  1.9  PHOS  --  2.6  AST 53*  --   ALT  37  --   ALKPHOS 81  --   BILITOT 1.2  --      Recent Labs Lab 09/25/15 0826 09/25/15 1522 09/25/15 1944 09/25/15 2326 09/26/15 0334 09/26/15 0708  GLUCAP 86 140* 85 102* 104* 114*    Recent Labs Lab 09/25/15 1101  PHART 7.26*  PCO2ART 54*  PO2ART 88    Recent Labs Lab 09/25/15 0817  AST 53*  ALT 37  ALKPHOS 81  BILITOT 1.2  ALBUMIN 4.2    Cardiac Enzymes  Recent Labs Lab  09/26/15 0111  TROPONINI 0.43*    RADIOLOGY:  Ct Head Wo Contrast  09/25/2015  CLINICAL DATA:  Seizure like activity EXAM: CT HEAD WITHOUT CONTRAST TECHNIQUE: Contiguous axial images were obtained from the base of the skull through the vertex without intravenous contrast. COMPARISON:  None FINDINGS: Evidence of multiple chronic bilateral cortical infarcts are noted including right frontal lobe posterior left frontal lobe and left occipital lobe. Low attenuation throughout the subcortical and periventricular white matter is identified compatible with chronic microvascular disease. Ex vacuo dilatation of the sulci and ventricles noted. There is no abnormal extra-axial fluid collection, intracranial hemorrhage or mass. The paranasal sinuses and mastoid air cells are clear. The calvarium is intact. IMPRESSION: 1. No acute intracranial abnormalities. 2. Chronic bilateral cortical infarcts, small vessel ischemic change and brain atrophy. Electronically Signed   By: Signa Kellaylor  Stroud M.D.   On: 09/25/2015 08:46   Dg Chest Port 1 View  09/25/2015  CLINICAL DATA:  Seizure. EXAM: PORTABLE CHEST 1 VIEW COMPARISON:  None. FINDINGS: There is cardiomegaly. Area consolidation noted in the left lower lobe and possible early infiltrate in the right base as well compatible with pneumonia. No effusions. No acute bony abnormality. IMPRESSION: Bilateral lower lobe airspace opacities, left greater than right. Favor multifocal pneumonia. Electronically Signed   By: Charlett NoseKevin  Dover M.D.   On: 09/25/2015 09:24       --Wells Guileseep Danisa Kopec, MD.  Board Certified in Internal Medicine, Pulmonary Medicine, Critical Care Medicine, and Sleep Medicine.  ICU Pager [phone] [facility] Office Number: [phone]  Santiago Gladavid Kasa, M.D.  Stephanie AcreVishal Mungal, M.D.  Billy Fischeravid Simonds, M.D   09/26/2015, 9:19 AM

## 2015-09-27 DIAGNOSIS — R310 Gross hematuria: Secondary | ICD-10-CM

## 2015-09-27 LAB — CBC
HCT: 35.6 % — ABNORMAL LOW (ref 40.0–52.0)
HEMOGLOBIN: 11.9 g/dL — AB (ref 13.0–18.0)
MCH: 29.4 pg (ref 26.0–34.0)
MCHC: 33.4 g/dL (ref 32.0–36.0)
MCV: 88 fL (ref 80.0–100.0)
Platelets: 112 10*3/uL — ABNORMAL LOW (ref 150–440)
RBC: 4.04 MIL/uL — AB (ref 4.40–5.90)
RDW: 15.2 % — ABNORMAL HIGH (ref 11.5–14.5)
WBC: 7.5 10*3/uL (ref 3.8–10.6)

## 2015-09-27 LAB — GLUCOSE, CAPILLARY
GLUCOSE-CAPILLARY: 118 mg/dL — AB (ref 65–99)
GLUCOSE-CAPILLARY: 119 mg/dL — AB (ref 65–99)
GLUCOSE-CAPILLARY: 128 mg/dL — AB (ref 65–99)
GLUCOSE-CAPILLARY: 130 mg/dL — AB (ref 65–99)
Glucose-Capillary: 181 mg/dL — ABNORMAL HIGH (ref 65–99)
Glucose-Capillary: 128 mg/dL — ABNORMAL HIGH (ref 65–99)
Glucose-Capillary: 120 mg/dL — ABNORMAL HIGH (ref 65–99)

## 2015-09-27 LAB — BASIC METABOLIC PANEL
ANION GAP: 5 (ref 5–15)
BUN: 9 mg/dL (ref 6–20)
CHLORIDE: 108 mmol/L (ref 101–111)
CO2: 25 mmol/L (ref 22–32)
CREATININE: 0.66 mg/dL (ref 0.61–1.24)
Calcium: 8.3 mg/dL — ABNORMAL LOW (ref 8.9–10.3)
GFR calc non Af Amer: >60 mL/min (ref >60)
Glucose, Bld: 131 mg/dL — ABNORMAL HIGH (ref 65–99)
POTASSIUM: 3.2 mmol/L — AB (ref 3.5–5.1)
SODIUM: 138 mmol/L (ref 135–145)

## 2015-09-27 LAB — PROCALCITONIN: PROCALCITONIN: 26.92 ng/mL

## 2015-09-27 MED ORDER — POTASSIUM CHLORIDE CRYS ER 20 MEQ PO TBCR
40.0000 meq | EXTENDED_RELEASE_TABLET | Freq: Once | ORAL | Status: AC
Start: 2015-09-27 — End: 2015-09-27
  Administered 2015-09-27: 40 meq via ORAL
  Filled 2015-09-27: qty 2

## 2015-09-27 NOTE — Progress Notes (Signed)
900cc/s h2o for ct scan

## 2015-09-27 NOTE — Progress Notes (Signed)
Nutrition Follow-up  DOCUMENTATION CODES:   Not applicable  INTERVENTION:  -Continue tube feeding of jevity 1.2 at goal rate of 2665ml/hr at this time to meet nutritional needs   NUTRITION DIAGNOSIS:   Inadequate oral intake related to inability to eat as evidenced by NPO status.    GOAL:   Patient will meet greater than or equal to 90% of their needs  Being addressed with tube feeding  MONITOR:   PO intake, TF tolerance, Weight trends, Labs, I & O's  REASON FOR ASSESSMENT:   Consult Enteral/tube feeding initiation and management  ASSESSMENT:   Pt admitted with possible aspiration pna with sepsis.  Pt tolerating jevity 1.2 at goal rate of 5165ml/hr at this time. Noted SLP recommendations to keep pt NPO during acute issues  Medications reviewed  Labs reviewed: K 3.2, glucose 131  UOP: 1500ml output noted.  Diet Order:  Diet NPO time specified Except for: Sips with Meds  Skin:  Reviewed, no issues  Last BM:  6/4  Height:   Ht Readings from Last 1 Encounters:  09/25/15 5\' 8"  (1.727 m)    Weight:   Wt Readings from Last 1 Encounters:  09/27/15 155 lb 10.3 oz (70.6 kg)     Ideal Body Weight:     BMI:  Body mass index is 23.67 kg/(m^2).  Estimated Nutritional Needs:   Kcal:  1850-2200kcals  Protein:  80-99g protein  Fluid:  >/= 1.8L fluid  EDUCATION NEEDS:   Education needs no appropriate at this time  Kota Ciancio B. Freida BusmanAllen, RD, LDN [phone] (pager) Weekend/On-Call pager [phone])

## 2015-09-27 NOTE — Progress Notes (Signed)
Urology Consult Follow Up  Subjective: Patient unable to give history. Foley in place, draining red tinged urine.  UOP is good.    Anti-infectives: Anti-infectives    Start     Dose/Rate Route Frequency Ordered Stop   09/25/15 1800  vancomycin (VANCOCIN) IVPB 1000 mg/200 mL premix  Status:  Discontinued     1,000 mg 200 mL/hr over 60 Minutes Intravenous Every 24 hours 09/25/15 1316 09/26/15 1236   09/25/15 1600  piperacillin-tazobactam (ZOSYN) IVPB 3.375 g     3.375 g 12.5 mL/hr over 240 Minutes Intravenous Every 8 hours 09/25/15 1310     09/25/15 1315  vancomycin (VANCOCIN) IVPB 1000 mg/200 mL premix  Status:  Discontinued     1,000 mg 200 mL/hr over 60 Minutes Intravenous  Once 09/25/15 1300 09/25/15 1308   09/25/15 0900  vancomycin (VANCOCIN) IVPB 1000 mg/200 mL premix     1,000 mg 200 mL/hr over 60 Minutes Intravenous  Once 09/25/15 0845 09/25/15 1100   09/25/15 0900  piperacillin-tazobactam (ZOSYN) IVPB 3.375 g     3.375 g 12.5 mL/hr over 240 Minutes Intravenous  Once 09/25/15 0845 09/25/15 0953   09/25/15 0900  cefTRIAXone (ROCEPHIN) 2 g in dextrose 5 % 50 mL IVPB     2 g 100 mL/hr over 30 Minutes Intravenous  Once 09/25/15 0849 09/25/15 1133   09/25/15 0845  cefTRIAXone (ROCEPHIN) 2 g in dextrose 5 % 50 mL IVPB  Status:  Discontinued     2 g 100 mL/hr over 30 Minutes Intravenous  Once 09/25/15 0845 09/25/15 0848      Current Facility-Administered Medications  Medication Dose Route Frequency Provider Last Rate Last Dose  . 0.9 %  sodium chloride infusion   Intravenous Continuous Wyatt Hasteavid K [name], MD 125 mL/hr at 09/27/15 [id number]    . acetaminophen (TYLENOL) tablet 650 mg  650 mg Oral Q6H PRN Wyatt Hasteavid K [name], MD   650 mg at 09/26/15 2248   Or  . acetaminophen (TYLENOL) suppository 650 mg  650 mg Rectal Q6H PRN Wyatt Hasteavid K [name], MD      . antiseptic oral rinse (CPC / CETYLPYRIDINIUM CHLORIDE 0.05%) solution 7 mL  7 mL Mouth Rinse q12n4p Wyatt Hasteavid K [name], MD   7 mL at 09/26/15 1600  .  apixaban (ELIQUIS) tablet 2.5 mg  2.5 mg Oral Q12H Wyatt Hasteavid K [name], MD   2.5 mg at 09/26/15 2247  . atorvastatin (LIPITOR) tablet 40 mg  40 mg Oral QHS Wyatt Hasteavid K [name], MD   40 mg at 09/26/15 2248  . baclofen (LIORESAL) tablet 10 mg  10 mg Oral TID Wyatt Hasteavid K [name], MD   10 mg at 09/26/15 2248  . chlorhexidine (PERIDEX) 0.12 % solution 15 mL  15 mL Mouth Rinse BID Wyatt Hasteavid K [name], MD   15 mL at 09/26/15 2247  . cholecalciferol (VITAMIN D) tablet 5,000 Units  5,000 Units Oral Daily Wyatt Hasteavid K [name], MD   5,000 Units at 09/26/15 0910  . feeding supplement (JEVITY 1.2 CAL) liquid 1,000 mL  1,000 mL Per Tube Continuous Shane CrutchPradeep Ramachandran, MD 65 mL/hr at 09/27/15 0540    . free water 100 mL  100 mL Per Tube Q8H Shane CrutchPradeep Ramachandran, MD   100 mL at 09/27/15 0600  . insulin aspart (novoLOG) injection 0-24 Units  0-24 Units Subcutaneous Q4H Shane CrutchPradeep Ramachandran, MD   2 Units at 09/27/15 0412  . ipratropium-albuterol (DUONEB) 0.5-2.5 (3) MG/3ML nebulizer solution 3 mL  3 mL Nebulization Q4H PRN Wyatt Hasteavid K [name], MD      .  morphine 2 MG/ML injection 2 mg  2 mg Intravenous Q4H PRN Wyatt Haste, MD      . ondansetron [facility]) tablet 4 mg  4 mg Oral Q6H PRN Wyatt Haste, MD       Or  . ondansetron [facility]) injection 4 mg  4 mg Intravenous Q6H PRN Wyatt Haste, MD      . oxyCODONE (Oxy IR/ROXICODONE) immediate release tablet 5 mg  5 mg Oral Q4H PRN Wyatt Haste, MD      . pantoprazole sodium (PROTONIX) 40 mg/20 mL oral suspension 40 mg  40 mg Oral [address] RPH   40 mg at 09/26/15 2248  . piperacillin-tazobactam (ZOSYN) IVPB 3.375 g  3.375 g Intravenous [address] [place], RPH   3.375 g at 09/27/15 8657  . sodium chloride flush (NS) 0.9 % injection 3 mL  3 mL Intravenous Q12H Wyatt Haste, MD   3 mL at 09/26/15 2249     Objective: Vital signs in last 24 hours: Temp:  [99.3 F (37.4 C)-101.4 F (38.6 C)] 100.9 F (38.3 C) (06/05 0527) Pulse Rate:  [75-98] 97 (06/05 0527) Resp:  [20-28] 22 (06/04  2048) BP: (119-144)/(63-86) 129/86 mmHg (06/05 0527) SpO2:  [97 %-100 %] 100 % (06/05 0527) Weight:  [155 lb 10.3 oz (70.6 kg)] 155 lb 10.3 oz (70.6 kg) (06/05 0415)  Intake/Output from previous day: 06/04 0701 - 06/05 0700 In: 4015.7 [I.V.:1910.8; NG/GT:2054.8; IV Piggyback:50] Out: 1500 [Urine:1500] Intake/Output this shift:     Physical Exam Constitutional: Well nourished. Alert and oriented, No acute distress. HEENT: [place] AT, moist mucus membranes. Trachea midline, no masses. Cardiovascular: No clubbing, cyanosis, or edema. Respiratory: Normal respiratory effort, no increased work of breathing. GI: Abdomen is soft, non tender, non distended, no abdominal masses. Liver and spleen not palpable.  No hernias appreciated.  Stool sample for occult testing is not indicated.   GU: No CVA tenderness.  No bladder fullness or masses.  Patient with uncircumcised phallus. Foley in place draining red tinged urine.  Skin: No rashes, bruises or suspicious lesions. Lymph: No cervical or inguinal adenopathy. Neurologic: Grossly intact, no focal deficits, moving all 4 extremities. Psychiatric: Normal mood and affect.  Lab Results:   Recent Labs  09/26/15 0111 09/27/15 0541  WBC 8.4 7.5  HGB 13.8 11.9*  HCT 41.3 35.6*  PLT 123* 112*   BMET  Recent Labs  09/26/15 0111 09/27/15 0541  NA 138 138  K 3.4* 3.2*  CL 102 108  CO2 24 25  GLUCOSE 122* 131*  BUN 15 9  CREATININE 1.00 0.66  CALCIUM 8.6* 8.3*   PT/INR  Recent Labs  09/25/15 2239  LABPROT 21.7*  INR 1.90   ABG  Recent Labs  09/25/15 1101  PHART 7.26*  HCO3 24.2    Studies/Results: Ct Head Wo Contrast  09/25/2015  CLINICAL DATA:  Seizure like activity EXAM: CT HEAD WITHOUT CONTRAST TECHNIQUE: Contiguous axial images were obtained from the base of the skull through the vertex without intravenous contrast. COMPARISON:  None FINDINGS: Evidence of multiple chronic bilateral cortical infarcts are noted including  right frontal lobe posterior left frontal lobe and left occipital lobe. Low attenuation throughout the subcortical and periventricular white matter is identified compatible with chronic microvascular disease. Ex vacuo dilatation of the sulci and ventricles noted. There is no abnormal extra-axial fluid collection, intracranial hemorrhage or mass. The paranasal sinuses and mastoid air cells are clear. The calvarium is intact. IMPRESSION: 1.  No acute intracranial abnormalities. 2. Chronic bilateral cortical infarcts, small vessel ischemic change and brain atrophy. Electronically Signed   By: Signa Kell M.D.   On: 09/25/2015 08:46   Dg Chest Port 1 View  09/25/2015  CLINICAL DATA:  Seizure. EXAM: PORTABLE CHEST 1 VIEW COMPARISON:  None. FINDINGS: There is cardiomegaly. Area consolidation noted in the left lower lobe and possible early infiltrate in the right base as well compatible with pneumonia. No effusions. No acute bony abnormality. IMPRESSION: Bilateral lower lobe airspace opacities, left greater than right. Favor multifocal pneumonia. Electronically Signed   By: Charlett Nose M.D.   On: 09/25/2015 09:24   Ct Hematuria Workup  09/27/2015  CLINICAL DATA:  Gross hematuria.  Seizure-like activity. EXAM: CT ABDOMEN AND PELVIS WITHOUT AND WITH CONTRAST TECHNIQUE: Multidetector CT imaging of the abdomen and pelvis was performed following the standard protocol before and following the bolus administration of intravenous contrast. CONTRAST:  ISOVUE-300 IOPAMIDOL (ISOVUE-300) INJECTION 61% COMPARISON:  None. FINDINGS: Lower chest and abdominal wall: Extensive patchy airspace disease in both bases. Hepatobiliary: Few tiny hepatic low densities are presumably cysts.No evidence of biliary obstruction or stone. Pancreas: Unremarkable. Spleen: Unremarkable. Adrenals/Urinary Tract: Negative adrenals. No upper tract explanation for hematuria, including calculus, renal mass, or urothelial thickening/inflammation. The  bladder is thick walled with trabeculated appearance on delayed phase. Tiny rounded high density on precontrast imaging is a calculus. Prominent bladder urothelial enhancement on nephrographic phase, present diffusely. No collecting system hematoma identified. Reproductive:No pathologic findings. Stomach/Bowel: No obstruction or inflammatory changes. Located percutaneous gastrostomy tube. Vascular/Lymphatic: No acute vascular abnormality. No mass or adenopathy. Peritoneal: No ascites or pneumoperitoneum. Musculoskeletal: Advanced multilevel disc degeneration with degenerative appearing endplate erosions. Degenerative and or erosive sacroiliac arthropathy. Advanced facet arthropathy with bulky spurring. Disorganized prominent trabeculae and cortical thickening in the proximal left femur without soft tissue mass or focal lucency. No superimposed fracture or bowing. IMPRESSION: 1. Small bladder calculus and cystitis. There is likely an element of chronic outlet obstruction given trabeculation and degree of bladder wall thickening. 2. No upper tract finding to explain hematuria. 3. Bilateral pneumonia. Given history of seizures there may be underlying aspiration. 4. Abnormal proximal left femur, favor Paget's. Electronically Signed   By: Marnee Spring M.D.   On: 09/27/2015 01:27     Assessment: Patient will gross hematuria.  CT Urogram demonstrated small bladder calculus and cystitis with trabeculation and degree of bladder wall thickening.  No upper tract findings to explain hematuria.  Plan: Plan for outpatient office cysto.  He would not be a good candidate for general anesthesia at this time.  Urology will sign off.  Will need follow up in our office 1 to 2 weeks after discharge.       LOS: 2 days    [facility] 09/27/2015   I have seen and examined the patient, labs/ imaging reviewed and discussed his management with Michiel Cowboy.  I reviewed the PA's note and agree with the documented  findings and plan of care.  Unclear if patient has chronic indwelling Foley. If catheter chronic, please leave in place.  If placed at the time of admission, then may be removed prior to discharge, check PVRs.    No UCx obtained, currently on abx for PNA.    Urology to sign off.    Vanna Scotland, MD

## 2015-09-27 NOTE — Progress Notes (Signed)
Pt with temp 102.8, MD Imogene Burn(Chen) paged. MD made aware of temp and blood cultures drawn on 6/5 @ 0900 for prior temp today. Chen ordered another blood culture to be drawn.

## 2015-09-27 NOTE — Progress Notes (Signed)
Speech Therapy Note: received order, reviewed chart notes. Pt has a known history of prior strokes with residual left-sided weakness, wheelchair bound at baseline, dysphagia, and dysarthria at baseline. Pt is being admitted w/ seizures. Pt has a PEG placement at baseline for nutrition/hydration needs; he receives po's of puree and thickened liquids for pleasure, per report. There was report of concern of "aspiration pneumonia" w/ this admission. After reviewing the chart and pt's baseline status/history, recommend pt remain NPO during this time of acute illness. After pt returns to SNF and has returned to his baseline medical status(hopefully improved than at this time), then a decision can be made for appropriateness for an assessment of reinitiating po's for pleasure, even an objective assessment could be considered. Recommend ongoing oral care for hygiene and stimulation. No further skilled ST services indicated at this time. NSG to reconsult if need for further education. NSG updated and agreed.

## 2015-09-27 NOTE — Progress Notes (Signed)
[facility] - [place] at [facility]   PATIENT NAME: Justin [name]    MRN#:  [id number]  DATE OF BIRTH:  04-Mar-1951  SUBJECTIVE:  Hospital Day: 2 days Justin [name] is a 65 y.o. male presenting with Seizures .   Overnight events: No overnight events Interval Events: Febrile today  REVIEW OF SYSTEMS:  Unable to obtain given baseline mental status -more alert close to baseline per family at bedside  DRUG ALLERGIES:  No Known Allergies  VITALS:  Blood pressure 132/74, pulse 87, temperature 101 F (38.3 C), temperature source Oral, resp. rate 19, height  (1.727 m), weight 155 lb 10.3 oz (70.6 kg), SpO2 100 %.  PHYSICAL EXAMINATION:  VITAL SIGNS: Filed Vitals:   09/27/15 0527 09/27/15 1200  BP: 129/86 132/74  Pulse: 97 87  Temp: 100.9 F (38.3 C) 101 F (38.3 C)  Resp:  19   GENERAL:65 y.o.male currently in No acute distress.  HEAD: Normocephalic, atraumatic.  EYES: Pupils equal, round, reactive to light. Extraocular muscles intact. No scleral icterus.  MOUTH: Dry mucosal membrane. Dentition intact. No abscess noted.  EAR, NOSE, THROAT: Clear without exudates. No external lesions.  NECK: Supple. No thyromegaly. No nodules. No JVD.  PULMONARY: Coarse rhonchi throughout without wheeze rails . No use of accessory muscles, Good respiratory effort. good air entry bilaterally CHEST: Nontender to palpation.  CARDIOVASCULAR: S1 and S2. Regular rate and rhythm. No murmurs, rubs, or gallops. No edema. Pedal pulses 2+ bilaterally.  GASTROINTESTINAL: Soft, nontender, nondistended. No masses. Positive bowel sounds. No hepatosplenomegaly.  MUSCULOSKELETAL: No swelling, clubbing, or edema. Passive Range of motion full in all extremities.  NEUROLOGIC: Unable to fully assess given patient's mental status  SKIN: No ulceration, lesions, rashes, or cyanosis. Skin warm and dry. Turgor intact.  PSYCHIATRIC: Mood, affect flat The patient is awake, seems non-verbal at  this time      LABORATORY PANEL:   CBC  Recent Labs Lab 09/27/15 0541  WBC 7.5  HGB 11.9*  HCT 35.6*  PLT 112*   ------------------------------------------------------------------------------------------------------------------  Chemistries   Recent Labs Lab 09/25/15 0817 09/26/15 0111 09/27/15 0541  NA 136 138 138  K 4.0 3.4* 3.2*  CL 99* 102 108  CO2 GLUCOSE 85 122* 131*  BUN CREATININE 1.79* 1.00 0.66  CALCIUM 9.8 8.6* 8.3*  MG  --  1.9  --   AST 53*  --   --   ALT 37  --   --   ALKPHOS 81  --   --   BILITOT 1.2  --   --    ------------------------------------------------------------------------------------------------------------------  Cardiac Enzymes  Recent Labs Lab 09/26/15 0111  TROPONINI 0.43*   ------------------------------------------------------------------------------------------------------------------  RADIOLOGY:  Ct Hematuria Workup  09/27/2015  CLINICAL DATA:  Gross hematuria.  Seizure-like activity. EXAM: CT ABDOMEN AND PELVIS WITHOUT AND WITH CONTRAST TECHNIQUE: Multidetector CT imaging of the abdomen and pelvis was performed following the standard protocol before and following the bolus administration of intravenous contrast. CONTRAST:  ISOVUE-300 IOPAMIDOL (ISOVUE-300) INJECTION 61% COMPARISON:  None. FINDINGS: Lower chest and abdominal wall: Extensive patchy airspace disease in both bases. Hepatobiliary: Few tiny hepatic low densities are presumably cysts.No evidence of biliary obstruction or stone. Pancreas: Unremarkable. Spleen: Unremarkable. Adrenals/Urinary Tract: Negative adrenals. No upper tract explanation for hematuria, including calculus, renal mass, or urothelial thickening/inflammation. The bladder is thick walled with trabeculated appearance on delayed phase. Tiny rounded high density on precontrast imaging is a calculus. Prominent  bladder urothelial enhancement on nephrographic phase, present diffusely.  No collecting system hematoma identified. Reproductive:No pathologic findings. Stomach/Bowel: No obstruction or inflammatory changes. Located percutaneous gastrostomy tube. Vascular/Lymphatic: No acute vascular abnormality. No mass or adenopathy. Peritoneal: No ascites or pneumoperitoneum. Musculoskeletal: Advanced multilevel disc degeneration with degenerative appearing endplate erosions. Degenerative and or erosive sacroiliac arthropathy. Advanced facet arthropathy with bulky spurring. Disorganized prominent trabeculae and cortical thickening in the proximal left femur without soft tissue mass or focal lucency. No superimposed fracture or bowing. IMPRESSION: 1. Small bladder calculus and cystitis. There is likely an element of chronic outlet obstruction given trabeculation and degree of bladder wall thickening. 2. No upper tract finding to explain hematuria. 3. Bilateral pneumonia. Given history of seizures there may be underlying aspiration. 4. Abnormal proximal left femur, favor Paget's. Electronically Signed   By: Marnee SpringJonathon  Watts [name].   On: 09/27/2015 01:27    EKG:   Orders placed or performed during the hospital encounter of 09/25/15  . ED EKG  . ED EKG    ASSESSMENT AND PLAN:   Justin [name] is a 65 y.o. male presenting with Seizures . Admitted 09/25/2015 : Day #: 2 days 1. Severe sepsis secondary to healthcare associated pneumonia: continue antibiotics,Remain on Zosyn for now ,continue supplemental oxygen as required, breathing treatments 2. Elevated troponin: Stabilized secondary to demand 3.Hematuria : Outpatient follow-up, and discontinue Foley catheter 4. Dietary: Tube feeds   All the records are reviewed and case discussed with Care Management/Social Workerr. Management plans discussed with the patient, family and they are in agreement.  CODE STATUS: full TOTAL TIME TAKING CARE OF THIS PATIENT: 28 minutes.   POSSIBLE D/C IN 2-3DAYS, DEPENDING ON CLINICAL CONDITION.   [name],   Justin [name] on 09/27/2015 at 2:59 PM  Between 7am to 6pm - Pager - [phone]  After 6pm: House Pager: - [phone]  [facility]  Office  [phone]  CC: Primary care physician; Dimple CaseySean A Smith, MD

## 2015-09-28 LAB — BASIC METABOLIC PANEL
Anion gap: 6 (ref 5–15)
BUN: 9 mg/dL (ref 6–20)
CALCIUM: 7.9 mg/dL — AB (ref 8.9–10.3)
CHLORIDE: 107 mmol/L (ref 101–111)
CO2: 24 mmol/L (ref 22–32)
CREATININE: 0.56 mg/dL — AB (ref 0.61–1.24)
GFR calc Af Amer: >60 mL/min (ref >60)
GFR calc non Af Amer: >60 mL/min (ref >60)
GLUCOSE: 127 mg/dL — AB (ref 65–99)
Potassium: 3.2 mmol/L — ABNORMAL LOW (ref 3.5–5.1)
Sodium: 137 mmol/L (ref 135–145)

## 2015-09-28 LAB — CBC
HCT: 32 % — ABNORMAL LOW (ref 40.0–52.0)
Hemoglobin: 10.9 g/dL — ABNORMAL LOW (ref 13.0–18.0)
MCH: 29.1 pg (ref 26.0–34.0)
MCHC: 33.9 g/dL (ref 32.0–36.0)
MCV: 85.8 fL (ref 80.0–100.0)
PLATELETS: 131 10*3/uL — AB (ref 150–440)
RBC: 3.73 MIL/uL — ABNORMAL LOW (ref 4.40–5.90)
RDW: 14.9 % — ABNORMAL HIGH (ref 11.5–14.5)
WBC: 7 10*3/uL (ref 3.8–10.6)

## 2015-09-28 LAB — GLUCOSE, CAPILLARY
GLUCOSE-CAPILLARY: 109 mg/dL — AB (ref 65–99)
GLUCOSE-CAPILLARY: 113 mg/dL — AB (ref 65–99)
GLUCOSE-CAPILLARY: 126 mg/dL — AB (ref 65–99)
GLUCOSE-CAPILLARY: 71 mg/dL (ref 65–99)
GLUCOSE-CAPILLARY: 89 mg/dL (ref 65–99)
Glucose-Capillary: 114 mg/dL — ABNORMAL HIGH (ref 65–99)

## 2015-09-28 LAB — C DIFFICILE QUICK SCREEN W PCR REFLEX
C DIFFICLE (CDIFF) ANTIGEN: NEGATIVE
C Diff interpretation: NEGATIVE
C Diff toxin: NEGATIVE

## 2015-09-28 NOTE — Progress Notes (Signed)
[facility] - [place] at [facility]   PATIENT NAME: Justin [name]    MRN#:  [id number]  DATE OF BIRTH:  07-26-1950  SUBJECTIVE:  Hospital Day: 3 days Justin [name] is a 65 y.o. male presenting with Seizures .   Overnight events: Afebrile overnight Interval Events: Some diarrhea  REVIEW OF SYSTEMS:  Unable to obtain given baseline mental status -more alert close to baseline per family at bedside  DRUG ALLERGIES:  No Known Allergies  VITALS:  Blood pressure 125/59, pulse 75, temperature 98.7 F (37.1 C), temperature source Oral, resp. rate 18, height  (1.727 m), weight 156 lb 1.4 oz (70.8 kg), SpO2 99 %.  PHYSICAL EXAMINATION:  VITAL SIGNS: Filed Vitals:   09/28/15 0443 09/28/15 1215  BP: 102/53 125/59  Pulse: 71 75  Temp:  98.7 F (37.1 C)  Resp:  18   GENERAL:65 y.o.male currently in No acute distress.  HEAD: Normocephalic, atraumatic.  EYES: Pupils equal, round, reactive to light. Extraocular muscles intact. No scleral icterus.  MOUTH: Dry mucosal membrane. Dentition intact. No abscess noted.  EAR, NOSE, THROAT: Clear without exudates. No external lesions.  NECK: Supple. No thyromegaly. No nodules. No JVD.  PULMONARY: Coarse rhonchi throughout without wheeze rails . No use of accessory muscles, Good respiratory effort. good air entry bilaterally CHEST: Nontender to palpation.  CARDIOVASCULAR: S1 and S2. Regular rate and rhythm. No murmurs, rubs, or gallops. No edema. Pedal pulses 2+ bilaterally.  GASTROINTESTINAL: Soft, nontender, nondistended. No masses. Positive bowel sounds. No hepatosplenomegaly.  MUSCULOSKELETAL: No swelling, clubbing, or edema. Passive Range of motion full in all extremities.  NEUROLOGIC: Unable to fully assess given patient's mental status  SKIN: No ulceration, lesions, rashes, or cyanosis. Skin warm and dry. Turgor intact.  PSYCHIATRIC: Mood, affect flat The patient is awake, seems non-verbal at this time       LABORATORY PANEL:   CBC  Recent Labs Lab 09/28/15 0910  WBC 7.0  HGB 10.9*  HCT 32.0*  PLT 131*   ------------------------------------------------------------------------------------------------------------------  Chemistries   Recent Labs Lab 09/25/15 0817 09/26/15 0111  09/28/15 0910  NA 136 138  < > 137  K 4.0 3.4*  < > 3.2*  CL 99* 102  < > 107  CO2 24 24  < > 24  GLUCOSE 85 122*  < > 127*  BUN 20 15  < > 9  CREATININE 1.79* 1.00  < > 0.56*  CALCIUM 9.8 8.6*  < > 7.9*  MG  --  1.9  --   --   AST 53*  --   --   --   ALT 37  --   --   --   ALKPHOS 81  --   --   --   BILITOT 1.2  --   --   --   < > = values in this interval not displayed. ------------------------------------------------------------------------------------------------------------------  Cardiac Enzymes  Recent Labs Lab 09/26/15 0111  TROPONINI 0.43*   ------------------------------------------------------------------------------------------------------------------  RADIOLOGY:  Ct Hematuria Workup  09/27/2015  CLINICAL DATA:  Gross hematuria.  Seizure-like activity. EXAM: CT ABDOMEN AND PELVIS WITHOUT AND WITH CONTRAST TECHNIQUE: Multidetector CT imaging of the abdomen and pelvis was performed following the standard protocol before and following the bolus administration of intravenous contrast. CONTRAST:  ISOVUE-300 IOPAMIDOL (ISOVUE-300) INJECTION 61% COMPARISON:  None. FINDINGS: Lower chest and abdominal wall: Extensive patchy airspace disease in both bases. Hepatobiliary: Few tiny hepatic low densities are presumably cysts.No evidence of biliary obstruction or  stone. Pancreas: Unremarkable. Spleen: Unremarkable. Adrenals/Urinary Tract: Negative adrenals. No upper tract explanation for hematuria, including calculus, renal mass, or urothelial thickening/inflammation. The bladder is thick walled with trabeculated appearance on delayed phase. Tiny rounded high density on precontrast  imaging is a calculus. Prominent bladder urothelial enhancement on nephrographic phase, present diffusely. No collecting system hematoma identified. Reproductive:No pathologic findings. Stomach/Bowel: No obstruction or inflammatory changes. Located percutaneous gastrostomy tube. Vascular/Lymphatic: No acute vascular abnormality. No mass or adenopathy. Peritoneal: No ascites or pneumoperitoneum. Musculoskeletal: Advanced multilevel disc degeneration with degenerative appearing endplate erosions. Degenerative and or erosive sacroiliac arthropathy. Advanced facet arthropathy with bulky spurring. Disorganized prominent trabeculae and cortical thickening in the proximal left femur without soft tissue mass or focal lucency. No superimposed fracture or bowing. IMPRESSION: 1. Small bladder calculus and cystitis. There is likely an element of chronic outlet obstruction given trabeculation and degree of bladder wall thickening. 2. No upper tract finding to explain hematuria. 3. Bilateral pneumonia. Given history of seizures there may be underlying aspiration. 4. Abnormal proximal left femur, favor Paget's. Electronically Signed   By: Marnee SpringJonathon  Watts M.D.   On: 09/27/2015 01:27    EKG:   Orders placed or performed during the hospital encounter of 09/25/15  . ED EKG  . ED EKG    ASSESSMENT AND PLAN:   Justin [name] is a 65 y.o. male presenting with Seizures . Admitted 09/25/2015 : Day #: 3 days 1. Severe sepsis secondary to healthcare associated pneumonia: continue antibiotics,Remain on Zosyn for now ,continue supplemental oxygen as required, breathing treatments Diarrhea check C. difficile 2. Elevated troponin: Stabilized secondary to demand 3.Hematuria : Outpatient follow-up,Foley out 4. Dietary: Tube feeds   All the records are reviewed and case discussed with Care Management/Social Workerr. Management plans discussed with the patient, family and they are in agreement.  CODE STATUS: full TOTAL TIME  TAKING CARE OF THIS PATIENT: 28 minutes.   POSSIBLE D/C IN 2-3DAYS, DEPENDING ON CLINICAL CONDITION.   [name],  Mardi MainlandDavid K M.D on 09/28/2015 at 1:18 PM  Between 7am to 6pm - Pager - [phone]  After 6pm: House Pager: - [phone]  [facility]  Office  [phone]  CC: Primary care physician; Dimple CaseySean A Smith, MD

## 2015-09-28 NOTE — Plan of Care (Signed)
Problem: Education: Goal: Knowledge of House General Education information/materials will improve Outcome: Progressing Cont IV abx per md order, replace K level per md order. Need to collect sputum culture, pt febrile over night 102.8; MD paged and made aware. No further orders.  Problem: Safety: Goal: Ability to remain free from injury will improve Outcome: Progressing Pt free of falls, call bell within reach. HOB 30 degrees or higher.   Problem: Pain Managment: Goal: General experience of comfort will improve Outcome: Progressing PAINAD 4/10, tylenol given for fever. Tmax 102.8  Problem: Skin Integrity: Goal: Risk for impaired skin integrity will decrease Outcome: Progressing Pt bathed, pt with dry/flaky skin.

## 2015-09-28 NOTE — Care Management Important Message (Signed)
Important Message  Patient Details  Name: Justin [name] MRN: [id number] Date of Birth: 05-21-1950   Medicare Important Message Given:  Yes    [name], [name], RN 09/28/2015, 12:14 PM

## 2015-09-28 NOTE — Consult Note (Signed)
Patient seems to be improving per documentation Will sign off at this time.  Please call us with any questions [phone]    Lucie LeatherKurian David Keisy Strickler, M.D.  [facility]  Medical Director [facility] Medical Director [facility]

## 2015-09-29 LAB — BASIC METABOLIC PANEL
ANION GAP: 8 (ref 5–15)
BUN: 12 mg/dL (ref 6–20)
CALCIUM: 7.6 mg/dL — AB (ref 8.9–10.3)
CHLORIDE: 104 mmol/L (ref 101–111)
CO2: 22 mmol/L (ref 22–32)
CREATININE: 0.63 mg/dL (ref 0.61–1.24)
GFR calc Af Amer: >60 mL/min (ref >60)
Glucose, Bld: 106 mg/dL — ABNORMAL HIGH (ref 65–99)
POTASSIUM: 3.2 mmol/L — AB (ref 3.5–5.1)
SODIUM: 134 mmol/L — AB (ref 135–145)

## 2015-09-29 LAB — CBC
HCT: 29 % — ABNORMAL LOW (ref 40.0–52.0)
HEMOGLOBIN: 10 g/dL — AB (ref 13.0–18.0)
MCH: 30 pg (ref 26.0–34.0)
MCHC: 34.6 g/dL (ref 32.0–36.0)
MCV: 86.8 fL (ref 80.0–100.0)
Platelets: 138 10*3/uL — ABNORMAL LOW (ref 150–440)
RBC: 3.34 MIL/uL — ABNORMAL LOW (ref 4.40–5.90)
RDW: 15.1 % — AB (ref 11.5–14.5)
WBC: 6.9 10*3/uL (ref 3.8–10.6)

## 2015-09-29 LAB — GLUCOSE, CAPILLARY
GLUCOSE-CAPILLARY: 110 mg/dL — AB (ref 65–99)
GLUCOSE-CAPILLARY: 115 mg/dL — AB (ref 65–99)
GLUCOSE-CAPILLARY: 86 mg/dL (ref 65–99)
GLUCOSE-CAPILLARY: 93 mg/dL (ref 65–99)
GLUCOSE-CAPILLARY: 95 mg/dL (ref 65–99)
Glucose-Capillary: 98 mg/dL (ref 65–99)
Glucose-Capillary: 69 mg/dL (ref 65–99)
Glucose-Capillary: 40 mg/dL — CL (ref 65–99)
Glucose-Capillary: 69 mg/dL (ref 65–99)

## 2015-09-29 LAB — DIFFERENTIAL
BASOS ABS: 0 10*3/uL (ref 0–0.1)
BASOS PCT: 1 %
Eosinophils Absolute: 0.1 10*3/uL (ref 0–0.7)
Eosinophils Relative: 1 %
LYMPHS PCT: 20 %
Lymphs Abs: 1.4 10*3/uL (ref 1.0–3.6)
MONOS PCT: 8 %
Monocytes Absolute: 0.6 10*3/uL (ref 0.2–1.0)
NEUTROS ABS: 5 10*3/uL (ref 1.4–6.5)
Neutrophils Relative %: 70 %

## 2015-09-29 MED ORDER — DEXTROSE 50 % IV SOLN
25.0000 mL | Freq: Once | INTRAVENOUS | Status: AC
  Administered 2015-09-29: 25 mL via INTRAVENOUS

## 2015-09-29 MED ORDER — DEXTROSE 50 % IV SOLN
25.0000 mL | Freq: Once | INTRAVENOUS | Status: AC
  Administered 2015-09-29: 25 mL via INTRAVENOUS
  Filled 2015-09-29: qty 50

## 2015-09-29 MED ORDER — DEXTROSE 5 % IV SOLN
500.0000 mg | INTRAVENOUS | Status: DC
  Administered 2015-09-30 – 2015-10-01 (×2): 500 mg via INTRAVENOUS
  Filled 2015-09-29 (×3): qty 500

## 2015-09-29 MED ORDER — DEXTROSE 5 % IV SOLN
500.0000 mg | Freq: Once | INTRAVENOUS | Status: AC
  Administered 2015-09-29: 500 mg via INTRAVENOUS
  Filled 2015-09-29: qty 500

## 2015-09-29 MED ORDER — POTASSIUM CHLORIDE 20 MEQ PO PACK
40.0000 meq | PACK | Freq: Once | ORAL | Status: AC
  Administered 2015-09-29: 40 meq
  Filled 2015-09-29: qty 2

## 2015-09-29 NOTE — Progress Notes (Signed)
Nutrition Follow-up  DOCUMENTATION CODES:   Not applicable  INTERVENTION:  -Continue jevity 1.2 at 1465ml/hr at this time to meet nutritional needs. Currently NPO. Once off all IV fluids recommend free water flush of 130ml 4 times per day to better meet fluid needs   NUTRITION DIAGNOSIS:   Inadequate oral intake related to inability to eat as evidenced by NPO status.    GOAL:   Patient will meet greater than or equal to 90% of their needs  Meeting needs as tolerating tube feeding  MONITOR:   PO intake, TF tolerance, Weight trends, Labs, I & O's  REASON FOR ASSESSMENT:   Consult Enteral/tube feeding initiation and management  ASSESSMENT:   Pt admitted with possible aspiration pna with sepsis.   Pt tolerating jevity 1.2 at 4665ml/hr, goal rate.    Medications reviewed:  Labs reviewed: Na 134, K 3.2, glucose 106  Gastrointestinal profile: c-diff negative noted.  Noted 4 loose stool in last 24 hr  UOP: noted urine output times 3 in last 24 hr  Diet Order:  Diet NPO time specified Except for: Sips with Meds  Skin:  Reviewed, no issues  Last BM:  6/4  Height:   Ht Readings from Last 1 Encounters:  09/25/15 5\' 8"  (1.727 m)    Weight:    Filed Weights   09/27/15 0415 09/28/15 0500 09/29/15 0542  Weight: 155 lb 10.3 oz (70.6 kg) 156 lb 1.4 oz (70.8 kg) 156 lb 9.6 oz (71.033 kg)    Ideal Body Weight:     BMI:  Body mass index is 23.82 kg/(m^2).  Estimated Nutritional Needs:   Kcal:  1850-2200kcals  Protein:  80-99g protein  Fluid:  >/= 1.8L fluid  EDUCATION NEEDS:   Education needs no appropriate at this time  Bari Handshoe B. Freida BusmanAllen, RD, LDN [phone] (pager) Weekend/On-Call pager [phone])

## 2015-09-29 NOTE — Progress Notes (Signed)
[facility] - [place] at [facility]   PATIENT NAME: Justin [name]    MRN#:  [id number]  DATE OF BIRTH:  15-Feb-1951  SUBJECTIVE:  Hospital Day: 4 days Mart Colpitts is a 65 y.o. male presenting with Seizures .   Overnight events: Afebrile overnight Interval Events: Patient unable to provide complaints  REVIEW OF SYSTEMS:  Unable to obtain given baseline mental status  DRUG ALLERGIES:  No Known Allergies  VITALS:  Blood pressure 113/58, pulse 40, temperature 99.3 F (37.4 C), temperature source Oral, resp. rate 17, height  (1.727 m), weight 156 lb 9.6 oz (71.033 kg), SpO2 100 %.  PHYSICAL EXAMINATION:  VITAL SIGNS: Filed Vitals:   09/29/15 0040 09/29/15 0430  BP: 109/68 113/58  Pulse: 95 40  Temp: 101.4 F (38.6 C) 99.3 F (37.4 C)  Resp: 18 17   GENERAL:65 y.o.male currently in No acute distress.  HEAD: Normocephalic, atraumatic.  EYES: Pupils equal, round, reactive to light. Extraocular muscles intact. No scleral icterus.  MOUTH: Dry mucosal membrane. Dentition intact. No abscess noted.  EAR, NOSE, THROAT: Clear without exudates. No external lesions.  NECK: Supple. No thyromegaly. No nodules. No JVD.  PULMONARY: improving Coarse rhonchi throughout without wheeze rails . No use of accessory muscles, Good respiratory effort. good air entry bilaterally CHEST: Nontender to palpation.  CARDIOVASCULAR: S1 and S2. Regular rate and rhythm. No murmurs, rubs, or gallops. No edema. Pedal pulses 2+ bilaterally.  GASTROINTESTINAL: Soft, nontender, nondistended. No masses. Positive bowel sounds. No hepatosplenomegaly.  MUSCULOSKELETAL: No swelling, clubbing, or edema. Passive Range of motion full in all extremities.  NEUROLOGIC: Unable to fully assess given patient's mental status  SKIN: No ulceration, lesions, rashes, or cyanosis. Skin warm and dry. Turgor intact.  PSYCHIATRIC: Mood, affect flat The patient is awake, seems non-verbal at this time       LABORATORY PANEL:   CBC  Recent Labs Lab 09/29/15 0306  WBC 6.9  HGB 10.0*  HCT 29.0*  PLT 138*   ------------------------------------------------------------------------------------------------------------------  Chemistries   Recent Labs Lab 09/25/15 0817 09/26/15 0111  09/29/15 0306  NA 136 138  < > 134*  K 4.0 3.4*  < > 3.2*  CL 99* 102  < > 104  CO2 24 24  < > 22  GLUCOSE 85 122*  < > 106*  BUN 20 15  < > 12  CREATININE 1.79* 1.00  < > 0.63  CALCIUM 9.8 8.6*  < > 7.6*  MG  --  1.9  --   --   AST 53*  --   --   --   ALT 37  --   --   --   ALKPHOS 81  --   --   --   BILITOT 1.2  --   --   --   < > = values in this interval not displayed. ------------------------------------------------------------------------------------------------------------------  Cardiac Enzymes  Recent Labs Lab 09/26/15 0111  TROPONINI 0.43*   ------------------------------------------------------------------------------------------------------------------  RADIOLOGY:  No results found.  EKG:   Orders placed or performed during the hospital encounter of 09/25/15  . ED EKG  . ED EKG    ASSESSMENT AND PLAN:   Makya Yurko is a 65 y.o. male presenting with Seizures . Admitted 09/25/2015 : Day #: 4 days 1. Severe sepsis secondary to healthcare associated pneumonia: continue antibiotics,Remain on Zosyn for now ,continue supplemental oxygen as required, breathing treatments-Given recurrent temperatures consult infectious disease, add azithromycin for atypical lung coverage Diarrhea: C. difficile negative  2. Elevated troponin: Stabilized secondary to demand 3.Hematuria : Outpatient follow-up no further issues at this time 4. Dietary: Tube feeds   All the records are reviewed and case discussed with Care Management/Social Workerr. Management plans discussed with the patient, family and they are in agreement.  CODE STATUS: full TOTAL TIME TAKING CARE OF THIS PATIENT: 33  minutes.   POSSIBLE D/C IN 2-3DAYS, DEPENDING ON CLINICAL CONDITION.   [name],  Mardi MainlandDavid K M.D on 09/29/2015 at 1:53 PM  Between 7am to 6pm - Pager - [phone]  After 6pm: House Pager: [phone]  [facility]  Office  [phone]  CC: Primary care physician; Dimple CaseySean A Smith, MD

## 2015-09-29 NOTE — Progress Notes (Signed)
Patient febrile this shift with temperatures >101.  Tylenol given with fever resolving upon reassessment.

## 2015-09-29 NOTE — Progress Notes (Signed)
Pharmacy Antibiotic Note  Justin [name] is a 65 y.o. male admitted on 09/25/2015 with sepsis/PNA(?aspiration).  Pharmacy has been consulted for piperacillin/tazobactam dosing. Vanc d/c 6/4.  Plan: Piperacillin/tazobactam 3.375 g IV q8h EI   Height: 5\' 8"  (172.7 cm) Weight: 156 lb 9.6 oz (71.033 kg) IBW/kg (Calculated) : 68.4  Temp (24hrs), Avg:100.2 F (37.9 C), Min:98.7 F (37.1 C), Max:101.4 F (38.6 C)   Recent Labs Lab 09/25/15 0817 09/25/15 0818 09/25/15 1131 09/25/15 2239 09/26/15 0111 09/27/15 0541 09/28/15 0910 09/29/15 0306  WBC 20.5*  --   --  7.2 8.4 7.5 7.0 6.9  CREATININE 1.79*  --   --   --  1.00 0.66 0.56* 0.63  LATICACIDVEN  --  3.5* 2.3*  --   --   --   --   --     Estimated Creatinine Clearance: 86.7 mL/min (by C-G formula based on Cr of 0.63).    No Known Allergies  Antimicrobials this admission: vancomycin 6/3 >> 6/4 Piperacillin/tazobactam 6/3 >>   Dose adjustments this admission:  Microbiology results: 6/3 BCx: NGTD x 2 6/3 MRSA PCR: negative CXR RLL infiltrate  Thank you for allowing pharmacy to be a part of this patient's care.  Sherry RuffingScarpena,Kainon Varady G, PharmD Clinical Pharmacist 09/29/2015 9:52 AM

## 2015-09-29 NOTE — Consult Note (Signed)
[facility] Infectious Disease     Reason for Consult: Sepsis    Referring Physician: Valentino Nose  Date of Admission:  09/25/2015   Active Problems:   Severe sepsis ([place])   Gross hematuria   HPI: Justin [name] is a 65 y.o. male admitted 6/3 from NH with witnessed seizure and AMS.  He had a cough for a few days prior, has a PEG tube.  On admission he was septic with Wbc 20 and temp 102.7.   CXR showed multifocal PNA and CT head just old infarcts.  CT abd pelvis done for hematuria showed extensive patch airspace dz in lower lobes as well as thickened bladder ad a calcilis in bladder.  BCX and C diff negative.  UA had 6-30 wbc - no culture done.  He has been on intiially vanco and zosyn then just zosyn from 6/3->> He initially defervesced and wbc came down to 8 but now with recurrent high fevers to 103 but wbc nml.   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  Past Medical History  Diagnosis Date  . Hypertension   . Atrial fibrillation ([place])   . Stroke ([place])   . Hyperlipidemia    History reviewed. No pertinent past surgical history. Social History  Substance Use Topics  . Smoking status: Never Smoker   . Smokeless tobacco: None  . Alcohol Use: No   Family History  Problem Relation Age of Onset  . Hypertension Other     Allergies: No Known Allergies  Current antibiotics: Antibiotics Given (last 72 hours)    Date/Time Action Medication Dose Rate   09/26/15 2256 Given   piperacillin-tazobactam (ZOSYN) IVPB 3.375 g 3.375 g 12.5 mL/hr   09/27/15 0538 Given   piperacillin-tazobactam (ZOSYN) IVPB 3.375 g 3.375 g 12.5 mL/hr   09/27/15 1409 Given   piperacillin-tazobactam (ZOSYN) IVPB 3.375 g 3.375 g 12.5 mL/hr   09/27/15 2239 Given   piperacillin-tazobactam (ZOSYN) IVPB 3.375 g 3.375 g 12.5 mL/hr   09/28/15 0516 Given   piperacillin-tazobactam (ZOSYN) IVPB 3.375 g 3.375 g 12.5 mL/hr   09/28/15 1608 Given    piperacillin-tazobactam (ZOSYN) IVPB 3.375 g 3.375 g 12.5 mL/hr   09/28/15 2340 Given   piperacillin-tazobactam (ZOSYN) IVPB 3.375 g 3.375 g 12.5 mL/hr   09/29/15 0618 Given   piperacillin-tazobactam (ZOSYN) IVPB 3.375 g 3.375 g 12.5 mL/hr   09/29/15 1401 Given   azithromycin (ZITHROMAX) 500 mg in dextrose 5 % 250 mL IVPB 500 mg 250 mL/hr      MEDICATIONS: . antiseptic oral rinse  7 mL Mouth Rinse q12n4p  . apixaban  2.5 mg Oral Q12H  . atorvastatin  40 mg Oral QHS  . azithromycin  500 mg Intravenous Once  . [START ON 09/30/2015] azithromycin  500 mg Intravenous Q24H  . baclofen  10 mg Oral TID  . chlorhexidine  15 mL Mouth Rinse BID  . cholecalciferol  5,000 Units Oral Daily  . free water  100 mL Per Tube Q8H  . insulin aspart  0-24 Units Subcutaneous Q4H  . pantoprazole sodium  40 mg Oral Q12H  . piperacillin-tazobactam (ZOSYN)  IV  3.375 g Intravenous Q8H  . sodium chloride flush  3 mL Intravenous Q12H    Review of Systems - unable to obtain OBJECTIVE: Temp:  [99.3 F (37.4 C)-101.4 F (38.6 C)] 99.3 F (37.4 C) (06/07 0430) Pulse Rate:  [40-95] 40 (06/07 0430) Resp:  [16-18] 17 (06/07 0430) BP: (109-113)/(58-68) 113/58 mmHg (  06/07 0430) SpO2:  [100 %] 100 % (06/07 0430) Weight:  [71.033 kg (156 lb 9.6 oz)] 71.033 kg (156 lb 9.6 oz) (06/07 0542) Physical Exam  Constitutional: aphasia, chronically ill appearing HENT: anicteric Mouth/Throat: Oropharynx is clear and dry. No oropharyngeal exudate.  Cardiovascular: Normal rate, regular rhythm and normal heart sounds.  Pulmonary/Chest: Effort normal and breath sounds normal. Bibasilar rhonchi Abdominal: Soft. Peg tube site with some drainage around it Lymphadenopathy: He has no cervical adenopathy.  Neurological: awake, alert but aphasia Ext R knee mod swollen, mildly warm -some pain with passive rom Skin: Skin is warm and dry. No rash noted. No erythema.  Psychiatric: aphasia    LABS: Results for orders placed or  performed during the hospital encounter of 09/25/15 (from the past 48 hour(s))  Glucose, capillary     Status: Abnormal   Collection Time: 09/27/15  4:46 PM  Result Value Ref Range   Glucose-Capillary 128 (H) 65 - 99 mg/dL  Glucose, capillary     Status: Abnormal   Collection Time: 09/27/15  7:59 PM  Result Value Ref Range   Glucose-Capillary 120 (H) 65 - 99 mg/dL  Culture, blood (single)     Status: None (Preliminary result)   Collection Time: 09/27/15  8:51 PM  Result Value Ref Range   Specimen Description BLOOD RIGHT ASSIST CONTROL    Special Requests BOTTLES DRAWN AEROBIC AND ANAEROBIC 8CC    Culture NO GROWTH < 24 HOURS    Report Status PENDING   Glucose, capillary     Status: None   Collection Time: 09/28/15 12:33 AM  Result Value Ref Range   Glucose-Capillary 71 65 - 99 mg/dL   Comment 1 Notify RN   Glucose, capillary     Status: Abnormal   Collection Time: 09/28/15  4:34 AM  Result Value Ref Range   Glucose-Capillary 126 (H) 65 - 99 mg/dL   Comment 1 Notify RN   Glucose, capillary     Status: Abnormal   Collection Time: 09/28/15  8:04 AM  Result Value Ref Range   Glucose-Capillary 109 (H) 65 - 99 mg/dL  Basic metabolic panel     Status: Abnormal   Collection Time: 09/28/15  9:10 AM  Result Value Ref Range   Sodium 137 135 - 145 mmol/L   Potassium 3.2 (L) 3.5 - 5.1 mmol/L   Chloride 107 101 - 111 mmol/L   CO2 24 22 - 32 mmol/L   Glucose, Bld 127 (H) 65 - 99 mg/dL   BUN 9 6 - 20 mg/dL   Creatinine, Ser 0.56 (L) 0.61 - 1.24 mg/dL   Calcium 7.9 (L) 8.9 - 10.3 mg/dL   GFR calc non Af Amer >60 >60 mL/min   GFR calc Af Amer >60 >60 mL/min    Comment: (NOTE) The eGFR has been calculated using the CKD EPI equation. This calculation has not been validated in all clinical situations. eGFR's persistently <60 mL/min signify possible Chronic Kidney Disease.    Anion gap 6 5 - 15  CBC     Status: Abnormal   Collection Time: 09/28/15  9:10 AM  Result Value Ref Range   WBC  7.0 3.8 - 10.6 K/uL   RBC 3.73 (L) 4.40 - 5.90 MIL/uL   Hemoglobin 10.9 (L) 13.0 - 18.0 g/dL   HCT 32.0 (L) 40.0 - 52.0 %   MCV 85.8 80.0 - 100.0 fL   MCH 29.1 26.0 - 34.0 pg   MCHC 33.9 32.0 - 36.0 g/dL  RDW 14.9 (H) 11.5 - 14.5 %   Platelets 131 (L) 150 - 440 K/uL  C difficile quick scan w PCR reflex     Status: None   Collection Time: 09/28/15 10:15 AM  Result Value Ref Range   C Diff antigen NEGATIVE NEGATIVE   C Diff toxin NEGATIVE NEGATIVE   C Diff interpretation Negative for C. difficile   Glucose, capillary     Status: None   Collection Time: 09/28/15 12:04 PM  Result Value Ref Range   Glucose-Capillary 89 65 - 99 mg/dL  Glucose, capillary     Status: Abnormal   Collection Time: 09/28/15  5:11 PM  Result Value Ref Range   Glucose-Capillary 113 (H) 65 - 99 mg/dL  Glucose, capillary     Status: Abnormal   Collection Time: 09/28/15  8:10 PM  Result Value Ref Range   Glucose-Capillary 114 (H) 65 - 99 mg/dL  Glucose, capillary     Status: Abnormal   Collection Time: 09/29/15 12:42 AM  Result Value Ref Range   Glucose-Capillary 110 (H) 65 - 99 mg/dL  Basic metabolic panel     Status: Abnormal   Collection Time: 09/29/15  3:06 AM  Result Value Ref Range   Sodium 134 (L) 135 - 145 mmol/L   Potassium 3.2 (L) 3.5 - 5.1 mmol/L   Chloride 104 101 - 111 mmol/L   CO2 22 22 - 32 mmol/L   Glucose, Bld 106 (H) 65 - 99 mg/dL   BUN 12 6 - 20 mg/dL   Creatinine, Ser 0.63 0.61 - 1.24 mg/dL   Calcium 7.6 (L) 8.9 - 10.3 mg/dL   GFR calc non Af Amer >60 >60 mL/min   GFR calc Af Amer >60 >60 mL/min    Comment: (NOTE) The eGFR has been calculated using the CKD EPI equation. This calculation has not been validated in all clinical situations. eGFR's persistently <60 mL/min signify possible Chronic Kidney Disease.    Anion gap 8 5 - 15  CBC     Status: Abnormal   Collection Time: 09/29/15  3:06 AM  Result Value Ref Range   WBC 6.9 3.8 - 10.6 K/uL   RBC 3.34 (L) 4.40 - 5.90 MIL/uL    Hemoglobin 10.0 (L) 13.0 - 18.0 g/dL   HCT 29.0 (L) 40.0 - 52.0 %   MCV 86.8 80.0 - 100.0 fL   MCH 30.0 26.0 - 34.0 pg   MCHC 34.6 32.0 - 36.0 g/dL   RDW 15.1 (H) 11.5 - 14.5 %   Platelets 138 (L) 150 - 440 K/uL  Glucose, capillary     Status: Abnormal   Collection Time: 09/29/15  4:28 AM  Result Value Ref Range   Glucose-Capillary 115 (H) 65 - 99 mg/dL  Glucose, capillary     Status: None   Collection Time: 09/29/15  7:28 AM  Result Value Ref Range   Glucose-Capillary 98 65 - 99 mg/dL  Glucose, capillary     Status: None   Collection Time: 09/29/15 12:00 PM  Result Value Ref Range   Glucose-Capillary 95 65 - 99 mg/dL   No components found for: ESR, C REACTIVE PROTEIN MICRO: Recent Results (from the past 720 hour(s))  Blood culture (routine x 2)     Status: None (Preliminary result)   Collection Time: 09/25/15  8:17 AM  Result Value Ref Range Status   Specimen Description BLOOD RIGHT ARM  Final   Special Requests BOTTLES DRAWN AEROBIC AND ANAEROBIC  10CC  Final  Culture NO GROWTH 3 DAYS  Final   Report Status PENDING  Incomplete  Blood culture (routine x 2)     Status: None (Preliminary result)   Collection Time: 09/25/15  9:35 AM  Result Value Ref Range Status   Specimen Description BLOOD RIGHT ARM  Final   Special Requests BOTTLES DRAWN AEROBIC AND ANAEROBIC  3CC  Final   Culture NO GROWTH 3 DAYS  Final   Report Status PENDING  Incomplete  MRSA PCR Screening     Status: None   Collection Time: 09/25/15  1:30 PM  Result Value Ref Range Status   MRSA by PCR NEGATIVE NEGATIVE Final    Comment:        The GeneXpert MRSA Assay (FDA approved for NASAL specimens only), is one component of a comprehensive MRSA colonization surveillance program. It is not intended to diagnose MRSA infection nor to guide or monitor treatment for MRSA infections.   CULTURE, BLOOD (ROUTINE X 2) w Reflex to ID Panel     Status: None (Preliminary result)   Collection Time: 09/27/15  9:24  AM  Result Value Ref Range Status   Specimen Description BLOOD RIGHT WRIST  Final   Special Requests BOTTLES DRAWN AEROBIC AND ANAEROBIC 5CC  Final   Culture NO GROWTH 1 DAY  Final   Report Status PENDING  Incomplete  CULTURE, BLOOD (ROUTINE X 2) w Reflex to ID Panel     Status: None (Preliminary result)   Collection Time: 09/27/15 10:22 AM  Result Value Ref Range Status   Specimen Description BLOOD RIGHT ASSIST CONTROL  Final   Special Requests BOTTLES DRAWN AEROBIC AND ANAEROBIC [place]  Final   Culture NO GROWTH 1 DAY  Final   Report Status PENDING  Incomplete  Culture, blood (single)     Status: None (Preliminary result)   Collection Time: 09/27/15  8:51 PM  Result Value Ref Range Status   Specimen Description BLOOD RIGHT ASSIST CONTROL  Final   Special Requests BOTTLES DRAWN AEROBIC AND ANAEROBIC 8CC  Final   Culture NO GROWTH < 24 HOURS  Final   Report Status PENDING  Incomplete  C difficile quick scan w PCR reflex     Status: None   Collection Time: 09/28/15 10:15 AM  Result Value Ref Range Status   C Diff antigen NEGATIVE NEGATIVE Final   C Diff toxin NEGATIVE NEGATIVE Final   C Diff interpretation Negative for C. difficile  Final    IMAGING: Ct Head Wo Contrast  09/25/2015  CLINICAL DATA:  Seizure like activity EXAM: CT HEAD WITHOUT CONTRAST TECHNIQUE: Contiguous axial images were obtained from the base of the skull through the vertex without intravenous contrast. COMPARISON:  None FINDINGS: Evidence of multiple chronic bilateral cortical infarcts are noted including right frontal lobe posterior left frontal lobe and left occipital lobe. Low attenuation throughout the subcortical and periventricular white matter is identified compatible with chronic microvascular disease. Ex vacuo dilatation of the sulci and ventricles noted. There is no abnormal extra-axial fluid collection, intracranial hemorrhage or mass. The paranasal sinuses and mastoid air cells are clear. The  calvarium is intact. IMPRESSION: 1. No acute intracranial abnormalities. 2. Chronic bilateral cortical infarcts, small vessel ischemic change and brain atrophy. Electronically Signed   By: Kerby Moors M.D.   On: 09/25/2015 08:46   Dg Chest Port 1 View  09/25/2015  CLINICAL DATA:  Seizure. EXAM: PORTABLE CHEST 1 VIEW COMPARISON:  None. FINDINGS: There is cardiomegaly. Area consolidation noted in the left  lower lobe and possible early infiltrate in the right base as well compatible with pneumonia. No effusions. No acute bony abnormality. IMPRESSION: Bilateral lower lobe airspace opacities, left greater than right. Favor multifocal pneumonia. Electronically Signed   By: Rolm Baptise M.D.   On: 09/25/2015 09:24   Ct Hematuria Workup  09/27/2015  CLINICAL DATA:  Gross hematuria.  Seizure-like activity. EXAM: CT ABDOMEN AND PELVIS WITHOUT AND WITH CONTRAST TECHNIQUE: Multidetector CT imaging of the abdomen and pelvis was performed following the standard protocol before and following the bolus administration of intravenous contrast. CONTRAST:  165m ISOVUE-300 IOPAMIDOL (ISOVUE-300) INJECTION 61% COMPARISON:  None. FINDINGS: Lower chest and abdominal wall: Extensive patchy airspace disease in both bases. Hepatobiliary: Few tiny hepatic low densities are presumably cysts.No evidence of biliary obstruction or stone. Pancreas: Unremarkable. Spleen: Unremarkable. Adrenals/Urinary Tract: Negative adrenals. No upper tract explanation for hematuria, including calculus, renal mass, or urothelial thickening/inflammation. The bladder is thick walled with trabeculated appearance on delayed phase. Tiny rounded high density on precontrast imaging is a calculus. Prominent bladder urothelial enhancement on nephrographic phase, present diffusely. No collecting system hematoma identified. Reproductive:No pathologic findings. Stomach/Bowel: No obstruction or inflammatory changes. Located percutaneous gastrostomy tube.  Vascular/Lymphatic: No acute vascular abnormality. No mass or adenopathy. Peritoneal: No ascites or pneumoperitoneum. Musculoskeletal: Advanced multilevel disc degeneration with degenerative appearing endplate erosions. Degenerative and or erosive sacroiliac arthropathy. Advanced facet arthropathy with bulky spurring. Disorganized prominent trabeculae and cortical thickening in the proximal left femur without soft tissue mass or focal lucency. No superimposed fracture or bowing. IMPRESSION: 1. Small bladder calculus and cystitis. There is likely an element of chronic outlet obstruction given trabeculation and degree of bladder wall thickening. 2. No upper tract finding to explain hematuria. 3. Bilateral pneumonia. Given history of seizures there may be underlying aspiration. 4. Abnormal proximal left femur, favor Paget's. Electronically Signed   By: JMonte FantasiaM.D.   On: 09/27/2015 01:27    Assessment:   MLoyde [name] a 65y.o. male with recurrent fever following admission for  Seizures and aspiration PNA.  Has been on zosyn, wbc down 20-6.9.  BCX neg, unable to obtain sputum.  He does on exam have some purulence around PEGG site which I have cultured. Also some swelling and warmth on R knee with some possible pain with passive ROM but diff to gauge.  C diff negative. Had foley in place following admission due to hematuria but has been removed and UA did not have a lot of wbc initially.  He may be having recurrent aspiration as cause of fevers. Most impressive finding is the infiltrate in his lung bases on the CT scan. Could have empyema but not much fluid on CT scan of abd.   Recommendations Continue current abx I have cultured the drainage from around P[place]   Check diff on CBC looking for eosinophila or drug fever If fevers recur would tap R knee.  Thank you very much for allowing me to participate in the care of this patient. Please call with questions.   DCheral Marker FOla Spurr MD

## 2015-09-30 LAB — CULTURE, BLOOD (ROUTINE X 2)
CULTURE: NO GROWTH
Culture: NO GROWTH

## 2015-09-30 LAB — GLUCOSE, CAPILLARY
GLUCOSE-CAPILLARY: 100 mg/dL — AB (ref 65–99)
GLUCOSE-CAPILLARY: 115 mg/dL — AB (ref 65–99)
GLUCOSE-CAPILLARY: 119 mg/dL — AB (ref 65–99)
GLUCOSE-CAPILLARY: 53 mg/dL — AB (ref 65–99)
GLUCOSE-CAPILLARY: 85 mg/dL (ref 65–99)
GLUCOSE-CAPILLARY: 86 mg/dL (ref 65–99)
Glucose-Capillary: 111 mg/dL — ABNORMAL HIGH (ref 65–99)
Glucose-Capillary: 51 mg/dL — ABNORMAL LOW (ref 65–99)
Glucose-Capillary: 62 mg/dL — ABNORMAL LOW (ref 65–99)
Glucose-Capillary: 91 mg/dL (ref 65–99)

## 2015-09-30 MED ORDER — DEXTROSE 50 % IV SOLN
INTRAVENOUS | Status: AC
  Administered 2015-09-30: 25 mL
  Filled 2015-09-30: qty 50

## 2015-09-30 MED ORDER — DEXTROSE-NACL 5-0.9 % IV SOLN
INTRAVENOUS | Status: DC
  Administered 2015-09-30: 75 mL/h via INTRAVENOUS
  Administered 2015-09-30 – 2015-10-01 (×2): via INTRAVENOUS

## 2015-09-30 MED ORDER — DEXTROSE 50 % IV SOLN
25.0000 mL | Freq: Once | INTRAVENOUS | Status: AC
  Administered 2015-09-30: 25 mL via INTRAVENOUS
  Filled 2015-09-30: qty 50

## 2015-09-30 MED ORDER — APIXABAN 5 MG PO TABS
5.0000 mg | ORAL_TABLET | Freq: Two times a day (BID) | ORAL | Status: DC
  Administered 2015-09-30 – 2015-10-02 (×4): 5 mg via ORAL
  Filled 2015-09-30 (×4): qty 1

## 2015-09-30 MED ORDER — POTASSIUM CHLORIDE 20 MEQ PO PACK
40.0000 meq | PACK | Freq: Two times a day (BID) | ORAL | Status: AC
  Administered 2015-09-30 (×2): 40 meq via ORAL
  Filled 2015-09-30 (×2): qty 2

## 2015-09-30 NOTE — Progress Notes (Signed)
Notified Dr.Crosley of pt fs being 62 and standing protocol to give 25ml of d50iv and to recheck fs in 15 min. Doctor acknowledged and no new orders.

## 2015-09-30 NOTE — Progress Notes (Signed)
[facility] Date of Admission:  09/25/2015     ID: Justin LippsMilton [name] is a 65 y.o. male with aspiration PNA, recurrent fever  Active Problems:   Severe sepsis ([facility])   Gross hematuria   Subjective: No fever since 6/6,   ROS  Unable to obtain  Medications:  Antibiotics Given (last 72 hours)    Date/Time Action Medication Dose Rate   09/27/15 1409 Given   piperacillin-tazobactam (ZOSYN) IVPB 3.375 g 3.375 g 12.5 mL/hr   09/27/15 2239 Given   piperacillin-tazobactam (ZOSYN) IVPB 3.375 g 3.375 g 12.5 mL/hr   09/28/15 0516 Given   piperacillin-tazobactam (ZOSYN) IVPB 3.375 g 3.375 g 12.5 mL/hr   09/28/15 1608 Given   piperacillin-tazobactam (ZOSYN) IVPB 3.375 g 3.375 g 12.5 mL/hr   09/28/15 2340 Given   piperacillin-tazobactam (ZOSYN) IVPB 3.375 g 3.375 g 12.5 mL/hr   09/29/15 0618 Given   piperacillin-tazobactam (ZOSYN) IVPB 3.375 g 3.375 g 12.5 mL/hr   09/29/15 1401 Given   azithromycin (ZITHROMAX) 500 mg in dextrose 5 % 250 mL IVPB 500 mg 250 mL/hr   09/29/15 1513 Given   piperacillin-tazobactam (ZOSYN) IVPB 3.375 g 3.375 g 12.5 mL/hr   09/29/15 2205 Given   piperacillin-tazobactam (ZOSYN) IVPB 3.375 g 3.375 g 12.5 mL/hr   09/30/15 0523 Given   piperacillin-tazobactam (ZOSYN) IVPB 3.375 g 3.375 g 12.5 mL/hr     . antiseptic oral rinse  7 mL Mouth Rinse q12n4p  . apixaban  2.5 mg Oral Q12H  . atorvastatin  40 mg Oral QHS  . azithromycin  500 mg Intravenous Q24H  . baclofen  10 mg Oral TID  . chlorhexidine  15 mL Mouth Rinse BID  . cholecalciferol  5,000 Units Oral Daily  . free water  100 mL Per Tube Q8H  . insulin aspart  0-24 Units Subcutaneous Q4H  . pantoprazole sodium  40 mg Oral Q12H  . piperacillin-tazobactam (ZOSYN)  IV  3.375 g Intravenous Q8H  . sodium chloride flush  3 mL Intravenous Q12H    Objective: Vital signs in last 24 hours: Temp:  [98.6 F (37 C)-99.6 F (37.6 C)] 99.6 F (37.6 C) (06/08 0648) Pulse Rate:  [73-91]  91 (06/08 0648) Resp:  [16-20] 20 (06/08 0648) BP: (100-116)/(53-68) 116/53 mmHg (06/08 0648) SpO2:  [97 %-100 %] 97 % (06/08 0648) Weight:  [71.079 kg (156 lb 11.2 oz)] 71.079 kg (156 lb 11.2 oz) (06/08 29560648) Constitutional: aphasia, chronically ill appearing HENT: anicteric Mouth/Throat: Oropharynx is clear and dry. No oropharyngeal exudate.  Cardiovascular: Normal rate, regular rhythm and normal heart sounds.  Pulmonary/Chest: Effort normal and breath sounds normal. Bibasilar rhonchi Abdominal: Soft. Peg tube site with some drainage around it Lymphadenopathy: He has no cervical adenopathy.  Neurological: awake, alert but aphasia Ext R knee mod swollen, mildly warm -some pain with passive rom Skin: Skin is warm and dry. No rash noted. No erythema.  Psychiatric: aphasia  Lab Results  Recent Labs  09/28/15 0910 09/29/15 0306  WBC 7.0 6.9  HGB 10.9* 10.0*  HCT 32.0* 29.0*  NA 137 134*  K 3.2* 3.2*  CL 107 104  CO2 24 22  BUN 9 12  CREATININE 0.56* 0.63    Microbiology: Results for orders placed or performed during the hospital encounter of 09/25/15  Blood culture (routine x 2)     Status: None   Collection Time: 09/25/15  8:17 AM  Result Value Ref Range Status   Specimen Description BLOOD RIGHT ARM  Final  Special Requests BOTTLES DRAWN AEROBIC AND ANAEROBIC  10CC  Final   Culture NO GROWTH 5 DAYS  Final   Report Status 09/30/2015 FINAL  Final  Blood culture (routine x 2)     Status: None   Collection Time: 09/25/15  9:35 AM  Result Value Ref Range Status   Specimen Description BLOOD RIGHT ARM  Final   Special Requests BOTTLES DRAWN AEROBIC AND ANAEROBIC  3CC  Final   Culture NO GROWTH 5 DAYS  Final   Report Status 09/30/2015 FINAL  Final  MRSA PCR Screening     Status: None   Collection Time: 09/25/15  1:30 PM  Result Value Ref Range Status   MRSA by PCR NEGATIVE NEGATIVE Final    Comment:        The GeneXpert MRSA Assay (FDA approved for NASAL  specimens only), is one component of a comprehensive MRSA colonization surveillance program. It is not intended to diagnose MRSA infection nor to guide or monitor treatment for MRSA infections.   CULTURE, BLOOD (ROUTINE X 2) w Reflex to ID Panel     Status: None (Preliminary result)   Collection Time: 09/27/15  9:24 AM  Result Value Ref Range Status   Specimen Description BLOOD RIGHT WRIST  Final   Special Requests BOTTLES DRAWN AEROBIC AND ANAEROBIC 5CC  Final   Culture NO GROWTH 3 DAYS  Final   Report Status PENDING  Incomplete  CULTURE, BLOOD (ROUTINE X 2) w Reflex to ID Panel     Status: None (Preliminary result)   Collection Time: 09/27/15 10:22 AM  Result Value Ref Range Status   Specimen Description BLOOD RIGHT ASSIST CONTROL  Final   Special Requests BOTTLES DRAWN AEROBIC AND ANAEROBIC 7CCAERO,5CCANA  Final   Culture NO GROWTH 3 DAYS  Final   Report Status PENDING  Incomplete  Culture, blood (single)     Status: None (Preliminary result)   Collection Time: 09/27/15  8:51 PM  Result Value Ref Range Status   Specimen Description BLOOD RIGHT ASSIST CONTROL  Final   Special Requests BOTTLES DRAWN AEROBIC AND ANAEROBIC 8CC  Final   Culture NO GROWTH 3 DAYS  Final   Report Status PENDING  Incomplete  C difficile quick scan w PCR reflex     Status: None   Collection Time: 09/28/15 10:15 AM  Result Value Ref Range Status   C Diff antigen NEGATIVE NEGATIVE Final   C Diff toxin NEGATIVE NEGATIVE Final   C Diff interpretation Negative for C. difficile  Final  Aerobic Culture (superficial specimen)     Status: None (Preliminary result)   Collection Time: 09/29/15  3:05 PM  Result Value Ref Range Status   Specimen Description G/T SITE  Final   Special Requests Normal  Final   Gram Stain   Final    RARE WBC PRESENT, PREDOMINANTLY PMN MODERATE YEAST FEW GRAM POSITIVE COCCOBACILLUS RARE GRAM VARIABLE ROD Performed at [facility]    Culture PENDING  Incomplete    Report Status PENDING  Incomplete    Studies/Results: No results found.  Assessment/Plan: Justin [name] is a 65 y.o. male with recurrent fever following admission for Seizures and aspiration PNA. Has been on zosyn, wbc down 20-6.9. BCX neg, unable to obtain sputum.  He does on exam have some purulence around PEGG site which I have cultured. Also some swelling and warmth on R knee with some possible pain with passive ROM but diff to gauge. C diff negative. Had foley in place  following admission due to hematuria but has been removed and UA did not have a lot of wbc initially.  He may be having recurrent aspiration as cause of fevers. Most impressive finding is the infiltrate in his lung bases on the CT scan. Could have empyema but not much fluid on CT scan of abd.   Recommendations Continue current abx Drainage from around Pegg is pending If fevers recur would tap R knee. Thank you very much for the consult. Will follow with you.  Justin [name] P   09/30/2015, 12:39 PM

## 2015-09-30 NOTE — Progress Notes (Signed)
[facility] - [place] at [facility]   PATIENT NAME: Justin [name]    MRN#:  [id number]  DATE OF BIRTH:  [birth year]  SUBJECTIVE:  Hospital Day: 5 days Justin [name] is a 65 y.o. male presenting with Seizures .   Overnight events: Afebrile overnight Interval Events: Patient unable to provide complaints  REVIEW OF SYSTEMS:  Unable to obtain given baseline mental status  DRUG ALLERGIES:  No Known Allergies  VITALS:  Blood pressure 92/56, pulse 69, temperature 98.2 F (36.8 C), temperature source Oral, resp. rate 24, height  (1.727 m), weight 156 lb 11.2 oz (71.079 kg), SpO2 100 %.  PHYSICAL EXAMINATION:  VITAL SIGNS: Filed Vitals:   09/30/15 1318 09/30/15 1356  BP: 92/56   Pulse: 69   Temp: 98.2 F (36.8 C)   Resp: 30 24   GENERAL:65 y.o.male currently in No acute distress.  HEAD: Normocephalic, atraumatic.  EYES: Pupils equal, round, reactive to light. Extraocular muscles intact. No scleral icterus.  MOUTH: Dry mucosal membrane. Dentition intact. No abscess noted.  EAR, NOSE, THROAT: Clear without exudates. No external lesions.  NECK: Supple. No thyromegaly. No nodules. No JVD.  PULMONARY: improving Coarse rhonchi throughout without wheeze rails . No use of accessory muscles, Good respiratory effort. good air entry bilaterally CHEST: Nontender to palpation.  CARDIOVASCULAR: S1 and S2. Regular rate and rhythm. No murmurs, rubs, or gallops. No edema. Pedal pulses 2+ bilaterally.  GASTROINTESTINAL: Soft, nontender, nondistended. No masses. Positive bowel sounds. No hepatosplenomegaly.  MUSCULOSKELETAL: No swelling, clubbing, or edema. Passive Range of motion full in all extremities.  NEUROLOGIC: Unable to fully assess given patient's mental status  SKIN: No ulceration, lesions, rashes, or cyanosis. Skin warm and dry. Turgor intact.  PSYCHIATRIC: Mood, affect flat The patient is awake, seems non-verbal at this time      LABORATORY PANEL:    CBC  Recent Labs Lab 09/29/15 0306  WBC 6.9  HGB 10.0*  HCT 29.0*  PLT 138*   ------------------------------------------------------------------------------------------------------------------  Chemistries   Recent Labs Lab 09/25/15 0817 09/26/15 0111  09/29/15 0306  NA 136 138  < > 134*  K 4.0 3.4*  < > 3.2*  CL 99* 102  < > 104  CO2 24 24  < > 22  GLUCOSE 85 122*  < > 106*  BUN 20 15  < > 12  CREATININE 1.79* 1.00  < > 0.63  CALCIUM 9.8 8.6*  < > 7.6*  MG  --  1.9  --   --   AST 53*  --   --   --   ALT 37  --   --   --   ALKPHOS 81  --   --   --   BILITOT 1.2  --   --   --   < > = values in this interval not displayed. ------------------------------------------------------------------------------------------------------------------  Cardiac Enzymes  Recent Labs Lab 09/26/15 0111  TROPONINI 0.43*   ------------------------------------------------------------------------------------------------------------------  RADIOLOGY:  No results found.  EKG:   Orders placed or performed during the hospital encounter of 09/25/15  . ED EKG  . ED EKG    ASSESSMENT AND PLAN:   Justin [name] is a 65 y.o. male presenting with Seizures . Admitted 09/25/2015 : Day #: 5 days 1. Severe sepsis secondary to healthcare associated pneumonia: continue antibiotics, Zosyn #5, continue supplemental oxygen as required, breathing treatments-ID input appreciated Diarrhea: C. difficile negative 2. Elevated troponin: Stabilized secondary to demand 3.Hematuria : Outpatient follow-up no further issues  at this time 4. Dietary: Tube feeds   All the records are reviewed and case discussed with Care Management/Social Workerr. Management plans discussed with the patient, family and they are in agreement.  CODE STATUS: full TOTAL TIME TAKING CARE OF THIS PATIENT: 33 minutes.   POSSIBLE D/C IN 2-3DAYS, DEPENDING ON CLINICAL CONDITION.   [name],  [name] on 09/30/2015 at 1:57  PM  Between 7am to 6pm - Pager - [phone]  After 6pm: House Pager: - [phone]  [facility]  Office  [phone]  CC: Primary care physician; Justin CaseySean A Smith, MD

## 2015-09-30 NOTE — Plan of Care (Signed)
Problem: Safety: Goal: Ability to remain free from injury will improve Outcome: Progressing Pt became hypoglycemic this shift, hypoglycemic protocol initiated, MD notified, IV fluids changed to D5w/NS. Blood sugar improved.

## 2015-09-30 NOTE — Progress Notes (Signed)
Inpatient Diabetes Program Recommendations  AACE/ADA: New Consensus Statement on Inpatient Glycemic Control (2015)  Target Ranges:  Prepandial:   less than 140 mg/dL      Peak postprandial:   less than 180 mg/dL (1-2 hours)      Critically ill patients:  140 - 180 mg/dL   Results for Justin [name], Justin [name] (MRN [id number]) as of 09/30/2015 13:48  Ref. Range 09/29/2015 20:12 09/29/2015 20:34 09/29/2015 21:03 09/29/2015 22:01 09/30/2015 00:13 09/30/2015 00:18 09/30/2015 01:12 09/30/2015 04:04 09/30/2015 07:35 09/30/2015 12:06  Glucose-Capillary Latest Ref Range: 65-99 mg/dL 69 40 (LL) 69 86 51 (L) 53 (L) 86 100 (H) 115 (H) 119 (H)   Review of Glycemic Control  Diabetes history: None Current orders for Inpatient glycemic control: Novolog 0-24 units Q4hrs.  Inpatient Diabetes Program Recommendations:  Correction (SSI): Patient not requiring SQ insulin in over 24 hours. Please d/c current Novolog Correction scale. If glucose start to trend upward, please use the Glycemic Control order set and order the Novolog Sensitive Correction.  Thanks,  Christena DeemShannon Jaaziel Peatross RN, MSN, [facility] Inpatient Diabetes Coordinator Team Pager [phone] (8a-5p)

## 2015-10-01 LAB — BASIC METABOLIC PANEL
Anion gap: 5 (ref 5–15)
BUN: 10 mg/dL (ref 6–20)
CALCIUM: 8.5 mg/dL — AB (ref 8.9–10.3)
CO2: 26 mmol/L (ref 22–32)
Chloride: 106 mmol/L (ref 101–111)
Creatinine, Ser: 0.59 mg/dL — ABNORMAL LOW (ref 0.61–1.24)
GFR calc Af Amer: >60 mL/min (ref >60)
Glucose, Bld: 119 mg/dL — ABNORMAL HIGH (ref 65–99)
POTASSIUM: 4.2 mmol/L (ref 3.5–5.1)
SODIUM: 137 mmol/L (ref 135–145)

## 2015-10-01 LAB — GLUCOSE, CAPILLARY
GLUCOSE-CAPILLARY: 99 mg/dL (ref 65–99)
GLUCOSE-CAPILLARY: 45 mg/dL — AB (ref 65–99)
GLUCOSE-CAPILLARY: 35 mg/dL — AB (ref 65–99)
GLUCOSE-CAPILLARY: 41 mg/dL — AB (ref 65–99)
GLUCOSE-CAPILLARY: 43 mg/dL — AB (ref 65–99)
Glucose-Capillary: 108 mg/dL — ABNORMAL HIGH (ref 65–99)
Glucose-Capillary: 113 mg/dL — ABNORMAL HIGH (ref 65–99)
Glucose-Capillary: 48 mg/dL — ABNORMAL LOW (ref 65–99)
Glucose-Capillary: 90 mg/dL (ref 65–99)
Glucose-Capillary: 96 mg/dL (ref 65–99)
Glucose-Capillary: 96 mg/dL (ref 65–99)

## 2015-10-01 MED ORDER — APIXABAN 5 MG PO TABS: 5.0000 mg | ORAL_TABLET | Freq: Two times a day (BID) | ORAL | Status: DC

## 2015-10-01 MED ORDER — DEXTROSE 50 % IV SOLN
25.0000 mL | Freq: Once | INTRAVENOUS | Status: AC
  Administered 2015-10-01: 25 mL via INTRAVENOUS

## 2015-10-01 MED ORDER — DEXTROSE 50 % IV SOLN
INTRAVENOUS | Status: AC
  Administered 2015-10-01: 13:00:00
  Filled 2015-10-01: qty 50

## 2015-10-01 MED ORDER — TRAMADOL HCL 50 MG PO TABS: 50.0000 mg | ORAL_TABLET | Freq: Two times a day (BID) | ORAL | Status: DC | PRN

## 2015-10-01 MED ORDER — DEXTROSE 50 % IV SOLN
25.0000 mL | Freq: Once | INTRAVENOUS | Status: DC
  Filled 2015-10-01: qty 50

## 2015-10-01 MED ORDER — JEVITY 1.2 CAL PO LIQD: 1000.0000 mL | ORAL | Status: DC

## 2015-10-01 MED ORDER — FREE WATER: 100.0000 mL | Freq: Three times a day (TID) | Status: AC

## 2015-10-01 MED ORDER — AMOXICILLIN-POT CLAVULANATE 875-125 MG PO TABS: 1.0000 | ORAL_TABLET | Freq: Two times a day (BID) | ORAL | Status: DC

## 2015-10-01 MED ORDER — AMOXICILLIN-POT CLAVULANATE 400-57 MG/5ML PO SUSR
875.0000 mg | Freq: Two times a day (BID) | ORAL | Status: DC
  Administered 2015-10-01 – 2015-10-02 (×2): 875 mg via ORAL
  Filled 2015-10-01 (×3): qty 10.9

## 2015-10-01 MED ORDER — AZITHROMYCIN 250 MG PO TABS: 250.0000 mg | ORAL_TABLET | Freq: Every day | ORAL | Status: DC

## 2015-10-01 MED ORDER — DEXTROSE-NACL 5-0.9 % IV SOLN
INTRAVENOUS | Status: DC
  Administered 2015-10-01 – 2015-10-02 (×2): via INTRAVENOUS

## 2015-10-01 NOTE — Discharge Summary (Addendum)
[place] at [place] NAME: Justin [name]    MR#:  [id number]  DATE OF BIRTH:  1951-04-02  DATE OF ADMISSION:  09/25/2015 ADMITTING PHYSICIAN: Lytle Butte, MD  DATE OF DISCHARGE: 10/02/2015  PRIMARY CARE PHYSICIAN: Donato Schultz, MD    ADMISSION DIAGNOSIS:   Seizure ([place]) [R56.9] Sepsis, due to unspecified organism ([place]) [A41.9]  DISCHARGE DIAGNOSIS:  Active Problems:   Severe sepsis ([place])   Aspiration Pneumonia   SECONDARY DIAGNOSIS:   Past Medical History  Diagnosis Date  . Hypertension   . Atrial fibrillation (Justin [name])   . Stroke (Justin [name])   . Hyperlipidemia     HOSPITAL COURSE:  Justin [name]  is a 65 y.o. male admitted 09/25/2015 with chief complaint Seizures . Please see H&P performed by Lytle Butte, MD for further information. Patient admitted from his nursing facility with concern for seizure like activity. Of note his temp 106F at time of questionable seizure. On arrival to the ED he required NRB for respiratory distress and met severe septic criteria. Started on broad spectrum antibiotics for presumptive aspiration pneumonia. He remained intermittently febrile over the next. Infectious disease consulted and aided in antibiotic management. He has remained afebrile over 48 hours, improvement of oxygeneration and mental status back to baseline.  DISCHARGE CONDITIONS:   stable  CONSULTS OBTAINED:  Treatment Team:  Lytle Butte, MD Laverle Hobby, MD Leonel Ramsay, MD  DRUG ALLERGIES:  No Known Allergies  DISCHARGE MEDICATIONS:   Current Discharge Medication List    START taking these medications   Details  amoxicillin-clavulanate (AUGMENTIN) 400-57 MG/5ML suspension Take 10.9 mLs (875 mg total) by mouth every 12 (twelve) hours. Continue for four more doses Qty: 50 mL, Refills: 0    Nutritional Supplements (FEEDING SUPPLEMENT, JEVITY 1.2 CAL,) LIQD Place 1,000 mLs into feeding tube continuous. 24m per  hour Qty: 10000 mL, Refills: 0    nystatin (MYCOSTATIN) 100000 UNIT/ML suspension Take 5 mLs (500,000 Units total) by mouth 4 (four) times daily. Qty: 200 mL, Refills: 0    Water For Irrigation, Sterile (FREE WATER) SOLN Place 100 mLs into feeding tube every 8 (eight) hours. Qty: 1000 mL, Refills: 0      CONTINUE these medications which have CHANGED   Details  apixaban (ELIQUIS) 5 MG TABS tablet Take 1 tablet (5 mg total) by mouth 2 (two) times daily. Qty: 60 tablet, Refills: 0    traMADol (ULTRAM) 50 MG tablet Take 1 tablet (50 mg total) by mouth every 12 (twelve) hours as needed for moderate pain. Qty: 30 tablet, Refills: 0      CONTINUE these medications which have NOT CHANGED   Details  acetaminophen (TYLENOL) 325 MG tablet 650 mg by Gastric Tube route every 4 (four) hours as needed for mild pain or moderate pain.    atorvastatin (LIPITOR) 40 MG tablet Take 40 mg by mouth at bedtime.    baclofen (LIORESAL) 10 MG tablet Take 10 mg by mouth 3 (three) times daily.    Cholecalciferol (VITAMIN D3) 5000 units TABS 5,000 Units by Gastric Tube route daily.    Menthol, Topical Analgesic, (BIOFREEZE) 4 % GEL Apply 1 application topically 2 (two) times daily. Apply to left shoulder and elbow for arthritis.    metoprolol tartrate (LOPRESSOR) 25 MG tablet Take 12.5 mg by mouth 2 (two) times daily.    Multiple Vitamins-Minerals (MULTIVITAMIN WITH MINERALS) tablet Take 1 tablet by mouth daily.    omeprazole (PRILOSEC)  2 mg/mL SUSP Take 40 mg by mouth every 12 (twelve) hours.           DIET:  NPO Jevity 1.2 @ 29m/h  DISCHARGE CONDITION:  Stable  ACTIVITY:  Activity as tolerated  OXYGEN:  Home Oxygen: No.   Oxygen Delivery: room air  DISCHARGE LOCATION:  nursing home   If you experience worsening of your admission symptoms, develop shortness of breath, life threatening emergency, suicidal or homicidal thoughts you must seek medical attention immediately by calling 911  or calling your MD immediately  if symptoms less severe.  You Must read complete instructions/literature along with all the possible adverse reactions/side effects for all the Medicines you take and that have been prescribed to you. Take any new Medicines after you have completely understood and accpet all the possible adverse reactions/side effects.   Please note  You were cared for by a hospitalist during your hospital stay. If you have any questions about your discharge medications or the care you received while you were in the hospital after you are discharged, you can call the unit and asked to speak with the hospitalist on call if the hospitalist that took care of you is not available. Once you are discharged, your primary care physician will handle any further medical issues. Please note that NO REFILLS for any discharge medications will be authorized once you are discharged, as it is imperative that you return to your primary care physician (or establish a relationship with a primary care physician if you do not have one) for your aftercare needs so that they can reassess your need for medications and monitor your lab values.    On the day of Discharge:   VITAL SIGNS:  Blood pressure 109/61, pulse 79, temperature 98 F (36.7 C), temperature source Oral, resp. rate 32, height _0  (1.727 m), weight 66.724 kg (147 lb 1.6 oz), SpO2 99 %.     PHYSICAL EXAMINATION:  GENERAL:  65y.o.-year-old patient lying in the bed with no acute distress.  EYES: Pupils equal, round, reactive to light and accommodation. No scleral icterus. Extraocular muscles intact.  HEENT: Head atraumatic, normocephalic. Oropharynx and nasopharynx clear.  NECK:  Supple, no jugular venous distention. No thyroid enlargement, no tenderness.  LUNGS: improved breath sounds bilaterally, no wheezing, rales,rhonchi or crepitation. No use of accessory muscles of respiration.  CARDIOVASCULAR: S1, S2 normal. No murmurs, rubs,  or gallops.  ABDOMEN: Soft, non-tender, non-distended. Bowel sounds present. No organomegaly or mass.  EXTREMITIES: No pedal edema, cyanosis, or clubbing.  NEUROLOGIC: unable to fully assess mental status given baseline. He opens his eyes to verbal command  PSYCHIATRIC: unable to fully assess mental status given baseline, non verbal SKIN: No obvious rash, lesion, or ulcer.   DATA REVIEW:   CBC  Recent Labs Lab 10/02/15 0533  WBC 5.3  HGB 11.2*  HCT 32.3*  PLT 223    Chemistries   Recent Labs Lab 09/26/15 0111  10/01/15 0545  NA 138  < > 137  K 3.4*  < > 4.2  CL 102  < > 106  CO2 24  < > 26  GLUCOSE 122*  < > 119*  BUN 15  < > 10  CREATININE 1.00  < > 0.59*  CALCIUM 8.6*  < > 8.5*  MG 1.9  --   --   < > = values in this interval not displayed.  Cardiac Enzymes  Recent Labs Lab 09/26/15 0111  TROPONINI 0.43*    Microbiology  Results  Results for orders placed or performed during the hospital encounter of 09/25/15  Blood culture (routine x 2)     Status: None   Collection Time: 09/25/15  8:17 AM  Result Value Ref Range Status   Specimen Description BLOOD RIGHT ARM  Final   Special Requests BOTTLES DRAWN AEROBIC AND ANAEROBIC  10CC  Final   Culture NO GROWTH 5 DAYS  Final   Report Status 09/30/2015 FINAL  Final  Blood culture (routine x 2)     Status: None   Collection Time: 09/25/15  9:35 AM  Result Value Ref Range Status   Specimen Description BLOOD RIGHT ARM  Final   Special Requests BOTTLES DRAWN AEROBIC AND ANAEROBIC  3CC  Final   Culture NO GROWTH 5 DAYS  Final   Report Status 09/30/2015 FINAL  Final  MRSA PCR Screening     Status: None   Collection Time: 09/25/15  1:30 PM  Result Value Ref Range Status   MRSA by PCR NEGATIVE NEGATIVE Final    Comment:        The GeneXpert MRSA Assay (FDA approved for NASAL specimens only), is one component of a comprehensive MRSA colonization surveillance program. It is not intended to diagnose MRSA infection  nor to guide or monitor treatment for MRSA infections.   CULTURE, BLOOD (ROUTINE X 2) w Reflex to ID Panel     Status: None (Preliminary result)   Collection Time: 09/27/15  9:24 AM  Result Value Ref Range Status   Specimen Description BLOOD RIGHT WRIST  Final   Special Requests BOTTLES DRAWN AEROBIC AND ANAEROBIC 5CC  Final   Culture NO GROWTH 4 DAYS  Final   Report Status PENDING  Incomplete  CULTURE, BLOOD (ROUTINE X 2) w Reflex to ID Panel     Status: None (Preliminary result)   Collection Time: 09/27/15 10:22 AM  Result Value Ref Range Status   Specimen Description BLOOD RIGHT ASSIST CONTROL  Final   Special Requests BOTTLES DRAWN AEROBIC AND ANAEROBIC [place]  Final   Culture NO GROWTH 4 DAYS  Final   Report Status PENDING  Incomplete  Culture, blood (single)     Status: None (Preliminary result)   Collection Time: 09/27/15  8:51 PM  Result Value Ref Range Status   Specimen Description BLOOD RIGHT ASSIST CONTROL  Final   Special Requests BOTTLES DRAWN AEROBIC AND ANAEROBIC 8CC  Final   Culture NO GROWTH 4 DAYS  Final   Report Status PENDING  Incomplete  C difficile quick scan w PCR reflex     Status: None   Collection Time: 09/28/15 10:15 AM  Result Value Ref Range Status   C Diff antigen NEGATIVE NEGATIVE Final   C Diff toxin NEGATIVE NEGATIVE Final   C Diff interpretation Negative for C. difficile  Final  Aerobic Culture (superficial specimen)     Status: None (Preliminary result)   Collection Time: 09/29/15  3:05 PM  Result Value Ref Range Status   Specimen Description G/T SITE  Final   Special Requests Normal  Final   Gram Stain   Final    RARE WBC PRESENT, PREDOMINANTLY PMN MODERATE YEAST FEW GRAM POSITIVE COCCOBACILLUS RARE GRAM VARIABLE ROD Performed at [facility]   Final   Report Status PENDING  Incomplete    RADIOLOGY:  No results found.   Management plans discussed with the  patient, family and they are in agreement.  CODE STATUS:     Code Status Orders        Start     Ordered   09/25/15 1004  Full code   Continuous     09/25/15 1003    Code Status History    Date Active Date Inactive Code Status Order ID Comments User Context   This patient has a current code status but no historical code status.      TOTAL TIME TAKING CARE OF THIS PATIENT: 35 minutes.    Loletha Grayer M.D on 10/02/2015 at 8:22 AM  Between 7am to 6pm - Pager - [phone]  After 6pm go to [email or web address] - Proofreader  [facility]  Office  [phone]  CC: Primary care physician; Donato Schultz, MD

## 2015-10-01 NOTE — Progress Notes (Signed)
[facility] - [place] at [facility] - [facility]   PATIENT NAME: Justin [name]    MRN#:  [id number]  DATE OF BIRTH:  [birth year]  SUBJECTIVE:  Hospital Day: 6 days Justin [name] is a 64 y.o. male presenting with Seizures .   Overnight events: Afebrile overnight Interval Events: G-tube clogged today, hypoglycemic episodes  REVIEW OF SYSTEMS:  Unable to obtain given baseline mental status  DRUG ALLERGIES:  No Known Allergies  VITALS:  Blood pressure 111/57, pulse 83, temperature 97.7 F (36.5 C), temperature source Oral, resp. rate 32, height  (1.727 m), weight 156 lb 9.6 oz (71.033 kg), SpO2 100 %.  PHYSICAL EXAMINATION:  VITAL SIGNS: Filed Vitals:   10/01/15 0444 10/01/15 1306  BP: 103/57 111/57  Pulse: 79 83  Temp: 98.6 F (37 C) 97.7 F (36.5 C)  Resp: 24 32   GENERAL:65 y.o.male currently in No acute distress.  HEAD: Normocephalic, atraumatic.  EYES: Pupils equal, round, reactive to light. Extraocular muscles intact. No scleral icterus.  MOUTH: Dry mucosal membrane. Dentition intact. No abscess noted.  EAR, NOSE, THROAT: Clear without exudates. No external lesions.  NECK: Supple. No thyromegaly. No nodules. No JVD.  PULMONARY: improving Coarse rhonchi throughout without wheeze rails . No use of accessory muscles, Good respiratory effort. good air entry bilaterally CHEST: Nontender to palpation.  CARDIOVASCULAR: S1 and S2. Regular rate and rhythm. No murmurs, rubs, or gallops. No edema. Pedal pulses 2+ bilaterally.  GASTROINTESTINAL: Soft, nontender, nondistended. No masses. Positive bowel sounds. No hepatosplenomegaly.  MUSCULOSKELETAL: No swelling, clubbing, or edema. Passive Range of motion full in all extremities.  NEUROLOGIC: Unable to fully assess given patient's mental status  SKIN: No ulceration, lesions, rashes, or cyanosis. Skin warm and dry. Turgor intact.  PSYCHIATRIC: Mood, affect flat The patient is awake, seems non-verbal at this time       LABORATORY PANEL:   CBC  Recent Labs Lab 09/29/15 0306  WBC 6.9  HGB 10.0*  HCT 29.0*  PLT 138*   ------------------------------------------------------------------------------------------------------------------  Chemistries   Recent Labs Lab 09/25/15 0817 09/26/15 0111  10/01/15 0545  NA 136 138  < > 137  K 4.0 3.4*  < > 4.2  CL 99* 102  < > 106  CO2 24 24  < > 26  GLUCOSE 85 122*  < > 119*  BUN 20 15  < > 10  CREATININE 1.79* 1.00  < > 0.59*  CALCIUM 9.8 8.6*  < > 8.5*  MG  --  1.9  --   --   AST 53*  --   --   --   ALT 37  --   --   --   ALKPHOS 81  --   --   --   BILITOT 1.2  --   --   --   < > = values in this interval not displayed. ------------------------------------------------------------------------------------------------------------------  Cardiac Enzymes  Recent Labs Lab 09/26/15 0111  TROPONINI 0.43*   ------------------------------------------------------------------------------------------------------------------  RADIOLOGY:  No results found.  EKG:   Orders placed or performed during the hospital encounter of 09/25/15  . ED EKG  . ED EKG    ASSESSMENT AND PLAN:   Justin [name] is a 65 y.o. male presenting with Seizures . Admitted 09/25/2015 : Day #: 6 days 1. Severe sepsis secondary to healthcare associated pneumonia: continue antibiotics, Zosyn #6, continue supplemental oxygen as required, breathing treatments-ID input appreciated Diarrhea: C. difficile negative 2. PEG tube malfunction: GI to fix, 3. Hypoglycemia: Likely secondary  to decreased tube feedings in setting of malfunctioning G-tube discussed case with dietary  Discharge in morning assuming blood glucose remained stable   All the records are reviewed and case discussed with Care Management/Social Workerr. Management plans discussed with the patient, family and they are in agreement.  CODE STATUS: full TOTAL TIME TAKING CARE OF THIS PATIENT: 33 minutes.    POSSIBLE D/C IN 2-3DAYS, DEPENDING ON CLINICAL CONDITION.   [name],  Mardi MainlandDavid K M.D on 10/01/2015 at 1:58 PM  Between 7am to 6pm - Pager - [phone]  After 6pm: House Pager: - [phone]  [facility]  Office  [phone]  CC: Primary care physician; Dimple CaseySean A Smith, MD

## 2015-10-01 NOTE — Progress Notes (Signed)
The patient had a clogged PEG tube which was clogged with tube feeds. The PEG tube was unclogged and flushed without any problems with sterile water. The PEG tube may be used for further feeding.

## 2015-10-01 NOTE — Progress Notes (Signed)
[facility] CLINIC INFECTIOUS DISEASE PROGRESS NOTE Date of Admission:  09/25/2015     ID: Justin [name] is a 65 y.o. male with aspiration PNA, recurrent fever  Active Problems:   Severe sepsis (HCC)   Gross hematuria   Subjective: No fever since 6/6,   ROS  Unable to obtain  Medications:  Antibiotics Given (last 72 hours)    Date/Time Action Medication Dose Rate   09/28/15 1608 Given   piperacillin-tazobactam (ZOSYN) IVPB 3.375 g 3.375 g 12.5 mL/hr   09/28/15 2340 Given   piperacillin-tazobactam (ZOSYN) IVPB 3.375 g 3.375 g 12.5 mL/hr   09/29/15 0618 Given   piperacillin-tazobactam (ZOSYN) IVPB 3.375 g 3.375 g 12.5 mL/hr   09/29/15 1401 Given   azithromycin (ZITHROMAX) 500 mg in dextrose 5 % 250 mL IVPB 500 mg 250 mL/hr   09/29/15 1513 Given   piperacillin-tazobactam (ZOSYN) IVPB 3.375 g 3.375 g 12.5 mL/hr   09/29/15 2205 Given   piperacillin-tazobactam (ZOSYN) IVPB 3.375 g 3.375 g 12.5 mL/hr   09/30/15 0523 Given   piperacillin-tazobactam (ZOSYN) IVPB 3.375 g 3.375 g 12.5 mL/hr   09/30/15 1350 Given   piperacillin-tazobactam (ZOSYN) IVPB 3.375 g 3.375 g 12.5 mL/hr   09/30/15 1734 Given   azithromycin (ZITHROMAX) 500 mg in dextrose 5 % 250 mL IVPB 500 mg 250 mL/hr   09/30/15 2140 Given   piperacillin-tazobactam (ZOSYN) IVPB 3.375 g 3.375 g 12.5 mL/hr   10/01/15 0513 Given   piperacillin-tazobactam (ZOSYN) IVPB 3.375 g 3.375 g 12.5 mL/hr     . antiseptic oral rinse  7 mL Mouth Rinse q12n4p  . apixaban  5 mg Oral Q12H  . atorvastatin  40 mg Oral QHS  . azithromycin  500 mg Intravenous Q24H  . baclofen  10 mg Oral TID  . chlorhexidine  15 mL Mouth Rinse BID  . cholecalciferol  5,000 Units Oral Daily  . dextrose  25 mL Intravenous Once  . free water  100 mL Per Tube Q8H  . pantoprazole sodium  40 mg Oral Q12H  . piperacillin-tazobactam (ZOSYN)  IV  3.375 g Intravenous Q8H  . sodium chloride flush  3 mL Intravenous Q12H    Objective: Vital signs in last 24 hours: Temp:   [97.7 F (36.5 C)-98.6 F (37 C)] 97.7 F (36.5 C) (06/09 1306) Pulse Rate:  [68-83] 83 (06/09 1306) Resp:  [16-32] 32 (06/09 1306) BP: (103-111)/(57-58) 111/57 mmHg (06/09 1306) SpO2:  [100 %] 100 % (06/09 0444) Weight:  [71.033 kg (156 lb 9.6 oz)] 71.033 kg (156 lb 9.6 oz) (06/09 0500) Constitutional: aphasia, chronically ill appearing HENT: anicteric Mouth/Throat: Oropharynx is clear and dry. No oropharyngeal exudate.  Cardiovascular: Normal rate, regular rhythm and normal heart sounds.  Pulmonary/Chest: Effort normal and breath sounds normal. Bibasilar rhonchi Abdominal: Soft. Peg tube site with some crusting  around it Lymphadenopathy: He has no cervical adenopathy.  Neurological: awake, alert but aphasia Ext R knee mod swollen, mildly warm -some pain with passive rom Skin: Skin is warm and dry. No rash noted. No erythema.  Psychiatric: aphasia  Lab Results  Recent Labs  09/29/15 0306 10/01/15 0545  WBC 6.9  --   HGB 10.0*  --   HCT 29.0*  --   NA 134* 137  K 3.2* 4.2  CL 104 106  CO2 22 26  BUN 12 10  CREATININE 0.63 0.59*    Microbiology: Results for orders placed or performed during the hospital encounter of 09/25/15  Blood culture (routine x 2)  Status: None   Collection Time: 09/25/15  8:17 AM  Result Value Ref Range Status   Specimen Description BLOOD RIGHT ARM  Final   Special Requests BOTTLES DRAWN AEROBIC AND ANAEROBIC  10CC  Final   Culture NO GROWTH 5 DAYS  Final   Report Status 09/30/2015 FINAL  Final  Blood culture (routine x 2)     Status: None   Collection Time: 09/25/15  9:35 AM  Result Value Ref Range Status   Specimen Description BLOOD RIGHT ARM  Final   Special Requests BOTTLES DRAWN AEROBIC AND ANAEROBIC  3CC  Final   Culture NO GROWTH 5 DAYS  Final   Report Status 09/30/2015 FINAL  Final  MRSA PCR Screening     Status: None   Collection Time: 09/25/15  1:30 PM  Result Value Ref Range Status   MRSA by PCR NEGATIVE NEGATIVE Final     Comment:        The GeneXpert MRSA Assay (FDA approved for NASAL specimens only), is one component of a comprehensive MRSA colonization surveillance program. It is not intended to diagnose MRSA infection nor to guide or monitor treatment for MRSA infections.   CULTURE, BLOOD (ROUTINE X 2) w Reflex to ID Panel     Status: None (Preliminary result)   Collection Time: 09/27/15  9:24 AM  Result Value Ref Range Status   Specimen Description BLOOD RIGHT WRIST  Final   Special Requests BOTTLES DRAWN AEROBIC AND ANAEROBIC 5CC  Final   Culture NO GROWTH 4 DAYS  Final   Report Status PENDING  Incomplete  CULTURE, BLOOD (ROUTINE X 2) w Reflex to ID Panel     Status: None (Preliminary result)   Collection Time: 09/27/15 10:22 AM  Result Value Ref Range Status   Specimen Description BLOOD RIGHT ASSIST CONTROL  Final   Special Requests BOTTLES DRAWN AEROBIC AND ANAEROBIC 7CCAERO,5CCANA  Final   Culture NO GROWTH 4 DAYS  Final   Report Status PENDING  Incomplete  Culture, blood (single)     Status: None (Preliminary result)   Collection Time: 09/27/15  8:51 PM  Result Value Ref Range Status   Specimen Description BLOOD RIGHT ASSIST CONTROL  Final   Special Requests BOTTLES DRAWN AEROBIC AND ANAEROBIC 8CC  Final   Culture NO GROWTH 4 DAYS  Final   Report Status PENDING  Incomplete  C difficile quick scan w PCR reflex     Status: None   Collection Time: 09/28/15 10:15 AM  Result Value Ref Range Status   C Diff antigen NEGATIVE NEGATIVE Final   C Diff toxin NEGATIVE NEGATIVE Final   C Diff interpretation Negative for C. difficile  Final  Aerobic Culture (superficial specimen)     Status: None (Preliminary result)   Collection Time: 09/29/15  3:05 PM  Result Value Ref Range Status   Specimen Description G/T SITE  Final   Special Requests Normal  Final   Gram Stain   Final    RARE WBC PRESENT, PREDOMINANTLY PMN MODERATE YEAST FEW GRAM POSITIVE COCCOBACILLUS RARE GRAM VARIABLE ROD     Culture   Final    CULTURE REINCUBATED FOR BETTER GROWTH Performed at [facility]    Report Status PENDING  Incomplete    Studies/Results: No results found.  Assessment/Plan: Justin [name] is a 65 y.o. male with recurrent fever following admission for Seizures and aspiration PNA. Has been on zosyn, wbc down 20-6.9. BCX neg, unable to obtain sputum.  He does on exam  have some purulence around PEGG site which I have cultured. Also some swelling and warmth on R knee with some possible pain with passive ROM but diff to gauge. C diff negative. Had foley in place following admission due to hematuria but has been removed and UA did not have a lot of wbc initially.  He may be having recurrent aspiration as cause of fevers. Most impressive finding is the infiltrate in his lung bases on the CT scan. Could have empyema but not much fluid on CT scan of abd.  Day 7/10 abx Recommendations Change zosyn to augmentin DC azitrho after todays dose (day 3/3) Would give 10 total days of abx- stop date 6/12 Drainage from around [place] is pending but site looks ok so unless worsens do not need to necessarily treat If fevers recur would tap R knee. Thank you very much for the consult. Will follow with you.  Justin [name] P   10/01/2015, 2:00 PM

## 2015-10-01 NOTE — Clinical Social Work Note (Signed)
[facility]. Patient will have to be seen by GI first in order to fix his PEG tube. Discharge information sent. Nurse to call report. Patient to transport via EMS. York SpanielMonica Takeo Harts MSW,LCSW [phone]

## 2015-10-01 NOTE — Progress Notes (Signed)
Brief Nutrition Follow-up  Discussed nutrition and poc with Dr. Clint GuyHower. Will increase Jevity 1.2 at 5875mL/hr to aid in blood glucose management. TF held for an hour per Nsg secondary to being clogged with reported issues overnight with clogging of TF.  Jevity 1.2 at 1875mL/hr to provide 2160kcals, 101g protein, and 1458mL of water with free water of 100mL TID.  RD notes D5 IVF has been discontinued.  Will continue to follow and assess.    Justin [name], RD, LDN Pager [phone] Weekend/On-Call Pager [phone]

## 2015-10-01 NOTE — Clinical Social Work Note (Signed)
Patient's discharge was cancelled for today. [facility] made aware. York SpanielMonica Hollister Wessler MSW,LCSW [phone]

## 2015-10-01 NOTE — Care Management Important Message (Signed)
Important Message  Patient Details  Name: Justin [name] MRN: [id number] Date of Birth: January 29, 1951   Medicare Important Message Given:  Yes    [name], Renne Platts T, RN 10/01/2015, 10:27 AM

## 2015-10-02 LAB — CBC
HEMATOCRIT: 32.3 % — AB (ref 40.0–52.0)
Hemoglobin: 11.2 g/dL — ABNORMAL LOW (ref 13.0–18.0)
MCH: 30.3 pg (ref 26.0–34.0)
MCHC: 34.6 g/dL (ref 32.0–36.0)
MCV: 87.7 fL (ref 80.0–100.0)
Platelets: 223 10*3/uL (ref 150–440)
RBC: 3.68 MIL/uL — AB (ref 4.40–5.90)
RDW: 15.3 % — ABNORMAL HIGH (ref 11.5–14.5)
WBC: 5.3 10*3/uL (ref 3.8–10.6)

## 2015-10-02 LAB — CULTURE, BLOOD (ROUTINE X 2)
Culture: NO GROWTH
Culture: NO GROWTH

## 2015-10-02 LAB — AEROBIC CULTURE  (SUPERFICIAL SPECIMEN)

## 2015-10-02 LAB — AEROBIC CULTURE W GRAM STAIN (SUPERFICIAL SPECIMEN): Special Requests: NORMAL

## 2015-10-02 LAB — CULTURE, BLOOD (SINGLE): Culture: NO GROWTH

## 2015-10-02 LAB — GLUCOSE, CAPILLARY: Glucose-Capillary: 103 mg/dL — ABNORMAL HIGH (ref 65–99)

## 2015-10-02 MED ORDER — NYSTATIN 100000 UNIT/ML MT SUSP
5.0000 mL | Freq: Four times a day (QID) | OROMUCOSAL | Status: DC
  Administered 2015-10-02: 500000 [IU] via ORAL
  Filled 2015-10-02: qty 5

## 2015-10-02 MED ORDER — JEVITY 1.2 CAL PO LIQD: 1000.0000 mL | ORAL | Status: AC

## 2015-10-02 MED ORDER — AMOXICILLIN-POT CLAVULANATE 400-57 MG/5ML PO SUSR: 875.0000 mg | Freq: Two times a day (BID) | ORAL | Status: DC

## 2015-10-02 MED ORDER — NYSTATIN 100000 UNIT/ML MT SUSP: 5.0000 mL | Freq: Four times a day (QID) | OROMUCOSAL | Status: DC

## 2015-10-02 NOTE — Progress Notes (Signed)
Clinical Social Worker was informed that [facility] Patient and his family are in a agreement with plan. [facility]. Provided patient's room number 69 and number to call for report [phone] . [facility]. Rx's added to discharge packet.   Call to patient's sister- Merdis DelayBernice to inform her [facility]. RN will call report and [facility].   Woodroe Modehristina [place] Sloop, MSW, LCSW-A Clinical Social Work Department [phone]

## 2015-10-02 NOTE — Progress Notes (Signed)
Patient ID: Justin [name], male   DOB: 09-14-1950, 65 y.o.   MRN: [id number] [employer] PROGRESS NOTE  Kekoa Fyock NWG:[id number] DOB: [birth year] DOA: 09/25/2015 PCP: Dimple Casey, MD  HPI/Subjective: Patient shakes head to yes or no questions.  Objective: Filed Vitals:   10/01/15 2030 10/02/15 0442  BP: 120/58 109/61  Pulse: 94 79  Temp: 98 F (36.7 C) 98 F (36.7 C)  Resp:      Intake/Output Summary (Last 24 hours) at 10/02/15 0828 Last data filed at 10/02/15 0748  Gross per 24 hour  Intake   2663 ml  Output      0 ml  Net   2663 ml   Filed Weights   09/30/15 0648 10/01/15 0500 10/02/15 0442  Weight: 71.079 kg (156 lb 11.2 oz) 71.033 kg (156 lb 9.6 oz) 66.724 kg (147 lb 1.6 oz)    ROS: Review of Systems  Respiratory: Negative for shortness of breath.   Cardiovascular: Negative for chest pain.  Gastrointestinal: Negative for nausea, vomiting and abdominal pain.  Musculoskeletal: Negative for joint pain.  Limited with patient unable to talk Exam: Physical Exam  HENT:  Head: Normocephalic.  Nose: No mucosal edema.  Mouth/Throat: No oropharyngeal exudate or posterior oropharyngeal edema.  Thrush on tongue  Eyes: Conjunctivae and lids are normal. Pupils are equal, round, and reactive to light.  Neck: Carotid bruit is not present. No thyroid mass present.  Cardiovascular: Regular rhythm, S1 normal, S2 normal and normal heart sounds.   Respiratory: He has no wheezes. He has no rhonchi. He has no rales.  GI: Soft. Bowel sounds are normal. There is no tenderness.  Musculoskeletal:       Right knee: He exhibits swelling.  Neurological: He is alert.  Skin: Skin is warm. No rash noted.  Psychiatric:  Shakes head to yes or no questions      Data Reviewed: Basic Metabolic Panel:  Recent Labs Lab 09/26/15 0111 09/27/15 0541 09/28/15 0910 09/29/15 0306 10/01/15 0545  NA 138 138 137 134* 137  K 3.4* 3.2* 3.2* 3.2* 4.2  CL 102 108 107 104 106  CO2 GLUCOSE 122* 131* 127* 106* 119*  BUN CREATININE 1.00 0.66 0.56* 0.63 0.59*  CALCIUM 8.6* 8.3* 7.9* 7.6* 8.5*  MG 1.9  --   --   --   --   PHOS 2.6  --   --   --   --    CBC:  Recent Labs Lab 09/26/15 0111 09/27/15 0541 09/28/15 0910 09/29/15 0306 10/02/15 0533  WBC 8.4 7.5 7.0 6.9 5.3  NEUTROABS  --   --   --  5.0  --   HGB 13.8 11.9* 10.9* 10.0* 11.2*  HCT 41.3 35.6* 32.0* 29.0* 32.3*  MCV 88.1 88.0 85.8 86.8 87.7  PLT 123* 112* 131* 138* 223   Cardiac Enzymes:  Recent Labs Lab 09/25/15 1307 09/25/15 1904 09/26/15 0111  TROPONINI 0.57* 0.46* 0.43*    CBG:  Recent Labs Lab 10/01/15 1324 10/01/15 1607 10/01/15 1635 10/01/15 2054 10/01/15 2352  GLUCAP 96 48* 96 90 108*    Recent Results (from the past 240 hour(s))  Blood culture (routine x 2)     Status: None   Collection Time: 09/25/15  8:17 AM  Result Value Ref Range Status   Specimen Description BLOOD RIGHT ARM  Final   Special Requests BOTTLES DRAWN AEROBIC AND ANAEROBIC  10CC  Final  Culture NO GROWTH 5 DAYS  Final   Report Status 09/30/2015 FINAL  Final  Blood culture (routine x 2)     Status: None   Collection Time: 09/25/15  9:35 AM  Result Value Ref Range Status   Specimen Description BLOOD RIGHT ARM  Final   Special Requests BOTTLES DRAWN AEROBIC AND ANAEROBIC  3CC  Final   Culture NO GROWTH 5 DAYS  Final   Report Status 09/30/2015 FINAL  Final  MRSA PCR Screening     Status: None   Collection Time: 09/25/15  1:30 PM  Result Value Ref Range Status   MRSA by PCR NEGATIVE NEGATIVE Final    Comment:        The GeneXpert MRSA Assay (FDA approved for NASAL specimens only), is one component of a comprehensive MRSA colonization surveillance program. It is not intended to diagnose MRSA infection nor to guide or monitor treatment for MRSA infections.   CULTURE, BLOOD (ROUTINE X 2) w Reflex to ID Panel     Status: None (Preliminary result)   Collection Time: 09/27/15  9:24  AM  Result Value Ref Range Status   Specimen Description BLOOD RIGHT WRIST  Final   Special Requests BOTTLES DRAWN AEROBIC AND ANAEROBIC 5CC  Final   Culture NO GROWTH 4 DAYS  Final   Report Status PENDING  Incomplete  CULTURE, BLOOD (ROUTINE X 2) w Reflex to ID Panel     Status: None (Preliminary result)   Collection Time: 09/27/15 10:22 AM  Result Value Ref Range Status   Specimen Description BLOOD RIGHT ASSIST CONTROL  Final   Special Requests BOTTLES DRAWN AEROBIC AND ANAEROBIC 7CCAERO,5CCANA  Final   Culture NO GROWTH 4 DAYS  Final   Report Status PENDING  Incomplete  Culture, blood (single)     Status: None (Preliminary result)   Collection Time: 09/27/15  8:51 PM  Result Value Ref Range Status   Specimen Description BLOOD RIGHT ASSIST CONTROL  Final   Special Requests BOTTLES DRAWN AEROBIC AND ANAEROBIC 8CC  Final   Culture NO GROWTH 4 DAYS  Final   Report Status PENDING  Incomplete  C difficile quick scan w PCR reflex     Status: None   Collection Time: 09/28/15 10:15 AM  Result Value Ref Range Status   C Diff antigen NEGATIVE NEGATIVE Final   C Diff toxin NEGATIVE NEGATIVE Final   C Diff interpretation Negative for C. difficile  Final  Aerobic Culture (superficial specimen)     Status: None (Preliminary result)   Collection Time: 09/29/15  3:05 PM  Result Value Ref Range Status   Specimen Description G/T SITE  Final   Special Requests Normal  Final   Gram Stain   Final    RARE WBC PRESENT, PREDOMINANTLY PMN MODERATE YEAST FEW GRAM POSITIVE COCCOBACILLUS RARE GRAM VARIABLE ROD [facility]    Culture MODERATE YEAST MODERATE GRAM NEGATIVE RODS   Final   Report Status PENDING  Incomplete      Scheduled Meds: . amoxicillin-clavulanate  875 mg Oral Q12H  . antiseptic oral rinse  7 mL Mouth Rinse q12n4p  . apixaban  5 mg Oral Q12H  . atorvastatin  40 mg Oral QHS  . baclofen  10 mg Oral TID  . chlorhexidine  15 mL Mouth Rinse BID  .  cholecalciferol  5,000 Units Oral Daily  . dextrose  25 mL Intravenous Once  . free water  100 mL Per Tube Q8H  . nystatin  5 mL Oral QID  . pantoprazole sodium  40 mg Oral Q12H  . sodium chloride flush  3 mL Intravenous Q12H   Continuous Infusions: . dextrose 5 % and 0.9% NaCl 75 mL/hr at 10/02/15 0537  . feeding supplement (JEVITY 1.2 CAL) 1,000 mL (10/01/15 1543)    Assessment/Plan:  1. Severe sepsis secondary to healthcare associated pneumonia. As per infectious disease specialist antibiotics switched over to by mouth Augmentin for 2 more days. Zithromax stopped. 2. Diarrhea. C. difficile negative. Likely secondary to antibiotics 3. Elevated troponin likely demand ischemia from severe sepsis 4. Hematuria. Follow-up as outpatient 5. Nutrition- continue tube feeds 75 cc an hour 6. Thrush nystatin swish and swallow 7. History of atrial fibrillation and stroke on eliquis 8. Hyperlipidemia unspecified on atorvastatin  Code Status:     Code Status Orders        Start     Ordered   09/25/15 1004  Full code   Continuous     09/25/15 1003    Code Status History    Date Active Date Inactive Code Status Order ID Comments User Context   This patient has a current code status but no historical code status.     Family Communication: Spoke with sister on the phone Disposition Plan: Back to rehabilitation facility today  Antibiotics:  Oral Augmentin  Time spent: 35 minutes  Alford Highland  [employer]

## 2015-10-04 LAB — GLUCOSE, CAPILLARY: Glucose-Capillary: 130 mg/dL — ABNORMAL HIGH (ref 65–99)

## 2015-10-05 ENCOUNTER — Telehealth: Payer: Self-pay | Admitting: Urology

## 2015-10-05 NOTE — Telephone Encounter (Signed)
Patient will gross hematuria. CT Urogram demonstrated small bladder calculus and cystitis with trabeculation and degree of bladder wall thickening. No upper tract findings to explain hematuria.  Plan: Plan for outpatient office cysto. He would not be a good candidate for general anesthesia at this time. Urology will sign off. Will need follow up in our office 1 to 2 weeks after discharge.

## 2015-10-19 ENCOUNTER — Other Ambulatory Visit: Payer: Medicare Other

## 2016-02-14 DIAGNOSIS — R569 Unspecified convulsions: Secondary | ICD-10-CM | POA: Diagnosis not present

## 2016-02-14 DIAGNOSIS — Z515 Encounter for palliative care: Secondary | ICD-10-CM | POA: Diagnosis not present

## 2016-02-14 DIAGNOSIS — Z66 Do not resuscitate: Secondary | ICD-10-CM | POA: Diagnosis not present

## 2016-02-14 DIAGNOSIS — I69854 Hemiplegia and hemiparesis following other cerebrovascular disease affecting left non-dominant side: Secondary | ICD-10-CM | POA: Diagnosis not present

## 2017-01-05 ENCOUNTER — Inpatient Hospital Stay
Admission: EM | Admit: 2017-01-05 | Discharge: 2017-01-10 | DRG: 871 | Disposition: A | Discharge status: Skilled Nursing Facility | Payer: Medicare Other | Attending: Internal Medicine | Admitting: Internal Medicine

## 2017-01-05 ENCOUNTER — Encounter: Payer: Self-pay | Admitting: Emergency Medicine

## 2017-01-05 ENCOUNTER — Emergency Department: Payer: Medicare Other

## 2017-01-05 DIAGNOSIS — Y95 Nosocomial condition: Secondary | ICD-10-CM | POA: Diagnosis present

## 2017-01-05 DIAGNOSIS — I10 Essential (primary) hypertension: Secondary | ICD-10-CM | POA: Diagnosis present

## 2017-01-05 DIAGNOSIS — Z79899 Other long term (current) drug therapy: Secondary | ICD-10-CM

## 2017-01-05 DIAGNOSIS — I69320 Aphasia following cerebral infarction: Secondary | ICD-10-CM | POA: Diagnosis not present

## 2017-01-05 DIAGNOSIS — E860 Dehydration: Secondary | ICD-10-CM | POA: Diagnosis present

## 2017-01-05 DIAGNOSIS — J690 Pneumonitis due to inhalation of food and vomit: Secondary | ICD-10-CM | POA: Diagnosis present

## 2017-01-05 DIAGNOSIS — R4182 Altered mental status, unspecified: Secondary | ICD-10-CM | POA: Diagnosis not present

## 2017-01-05 DIAGNOSIS — E876 Hypokalemia: Secondary | ICD-10-CM | POA: Diagnosis present

## 2017-01-05 DIAGNOSIS — G9341 Metabolic encephalopathy: Secondary | ICD-10-CM | POA: Diagnosis present

## 2017-01-05 DIAGNOSIS — E785 Hyperlipidemia, unspecified: Secondary | ICD-10-CM | POA: Diagnosis present

## 2017-01-05 DIAGNOSIS — E8770 Fluid overload, unspecified: Secondary | ICD-10-CM | POA: Diagnosis not present

## 2017-01-05 DIAGNOSIS — J189 Pneumonia, unspecified organism: Secondary | ICD-10-CM | POA: Diagnosis present

## 2017-01-05 DIAGNOSIS — G40909 Epilepsy, unspecified, not intractable, without status epilepticus: Secondary | ICD-10-CM | POA: Diagnosis present

## 2017-01-05 DIAGNOSIS — M2450 Contracture, unspecified joint: Secondary | ICD-10-CM | POA: Diagnosis present

## 2017-01-05 DIAGNOSIS — Z66 Do not resuscitate: Secondary | ICD-10-CM | POA: Diagnosis present

## 2017-01-05 DIAGNOSIS — I482 Chronic atrial fibrillation: Secondary | ICD-10-CM | POA: Diagnosis present

## 2017-01-05 DIAGNOSIS — R1310 Dysphagia, unspecified: Secondary | ICD-10-CM | POA: Diagnosis present

## 2017-01-05 DIAGNOSIS — I248 Other forms of acute ischemic heart disease: Secondary | ICD-10-CM | POA: Diagnosis present

## 2017-01-05 DIAGNOSIS — A419 Sepsis, unspecified organism: Principal | ICD-10-CM | POA: Diagnosis present

## 2017-01-05 DIAGNOSIS — Z931 Gastrostomy status: Secondary | ICD-10-CM

## 2017-01-05 DIAGNOSIS — E861 Hypovolemia: Secondary | ICD-10-CM | POA: Diagnosis present

## 2017-01-05 DIAGNOSIS — Z7901 Long term (current) use of anticoagulants: Secondary | ICD-10-CM

## 2017-01-05 DIAGNOSIS — B370 Candidal stomatitis: Secondary | ICD-10-CM | POA: Diagnosis present

## 2017-01-05 DIAGNOSIS — I69354 Hemiplegia and hemiparesis following cerebral infarction affecting left non-dominant side: Secondary | ICD-10-CM | POA: Diagnosis not present

## 2017-01-05 DIAGNOSIS — J449 Chronic obstructive pulmonary disease, unspecified: Secondary | ICD-10-CM | POA: Diagnosis present

## 2017-01-05 DIAGNOSIS — E870 Hyperosmolality and hypernatremia: Secondary | ICD-10-CM | POA: Diagnosis present

## 2017-01-05 DIAGNOSIS — A0472 Enterocolitis due to Clostridium difficile, not specified as recurrent: Secondary | ICD-10-CM | POA: Diagnosis present

## 2017-01-05 DIAGNOSIS — I69391 Dysphagia following cerebral infarction: Secondary | ICD-10-CM

## 2017-01-05 DIAGNOSIS — R6521 Severe sepsis with septic shock: Secondary | ICD-10-CM | POA: Diagnosis present

## 2017-01-05 DIAGNOSIS — R0602 Shortness of breath: Secondary | ICD-10-CM

## 2017-01-05 HISTORY — DX: Unspecified convulsions: R56.9

## 2017-01-05 LAB — COMPREHENSIVE METABOLIC PANEL
ALBUMIN: 2.5 g/dL — AB (ref 3.5–5.0)
ALK PHOS: 55 U/L (ref 38–126)
ALT: 82 U/L — ABNORMAL HIGH (ref 17–63)
ANION GAP: 9 (ref 5–15)
AST: 68 U/L — ABNORMAL HIGH (ref 15–41)
BILIRUBIN TOTAL: 0.8 mg/dL (ref 0.3–1.2)
BUN: 32 mg/dL — AB (ref 6–20)
CALCIUM: 7.5 mg/dL — AB (ref 8.9–10.3)
CO2: 21 mmol/L — ABNORMAL LOW (ref 22–32)
Chloride: 118 mmol/L — ABNORMAL HIGH (ref 101–111)
Creatinine, Ser: 0.85 mg/dL (ref 0.61–1.24)
GFR calc Af Amer: >60 mL/min (ref >60)
GFR calc non Af Amer: >60 mL/min (ref >60)
GLUCOSE: 256 mg/dL — AB (ref 65–99)
Potassium: 3.5 mmol/L (ref 3.5–5.1)
Sodium: 148 mmol/L — ABNORMAL HIGH (ref 135–145)
TOTAL PROTEIN: 6.4 g/dL — AB (ref 6.5–8.1)

## 2017-01-05 LAB — PROCALCITONIN: Procalcitonin: 0.69 ng/mL

## 2017-01-05 LAB — LACTIC ACID, PLASMA
LACTIC ACID, VENOUS: 4.4 mmol/L — AB (ref 0.5–1.9)
Lactic Acid, Venous: 3.6 mmol/L (ref 0.5–1.9)
Lactic Acid, Venous: 2.9 mmol/L (ref 0.5–1.9)

## 2017-01-05 LAB — URINALYSIS, ROUTINE W REFLEX MICROSCOPIC
BACTERIA UA: NONE SEEN
Bilirubin Urine: NEGATIVE
Glucose, UA: NEGATIVE mg/dL
HGB URINE DIPSTICK: NEGATIVE
Ketones, ur: 5 mg/dL — AB
Leukocytes, UA: NEGATIVE
NITRITE: NEGATIVE
PROTEIN: 100 mg/dL — AB
Specific Gravity, Urine: 1.038 — ABNORMAL HIGH (ref 1.005–1.030)
Squamous Epithelial / LPF: NONE SEEN
pH: 5 (ref 5.0–8.0)

## 2017-01-05 LAB — CBC WITH DIFFERENTIAL/PLATELET
BASOS PCT: 0 %
Basophils Absolute: 0.1 10*3/uL (ref 0–0.1)
Eosinophils Absolute: 0 10*3/uL (ref 0–0.7)
Eosinophils Relative: 0 %
HEMATOCRIT: 50.1 % (ref 40.0–52.0)
Hemoglobin: 16.7 g/dL (ref 13.0–18.0)
LYMPHS ABS: 1.1 10*3/uL (ref 1.0–3.6)
Lymphocytes Relative: 7 %
MCH: 29.5 pg (ref 26.0–34.0)
MCHC: 33.3 g/dL (ref 32.0–36.0)
MCV: 88.7 fL (ref 80.0–100.0)
MONOS PCT: 8 %
Monocytes Absolute: 1.3 10*3/uL — ABNORMAL HIGH (ref 0.2–1.0)
Neutro Abs: 14 10*3/uL — ABNORMAL HIGH (ref 1.4–6.5)
Neutrophils Relative %: 85 %
Platelets: 226 10*3/uL (ref 150–440)
RBC: 5.64 MIL/uL (ref 4.40–5.90)
RDW: 14.8 % — ABNORMAL HIGH (ref 11.5–14.5)
WBC: 16.4 10*3/uL — ABNORMAL HIGH (ref 3.8–10.6)

## 2017-01-05 LAB — PHOSPHORUS: Phosphorus: 2.6 mg/dL (ref 2.5–4.6)

## 2017-01-05 LAB — GLUCOSE, CAPILLARY: Glucose-Capillary: 100 mg/dL — ABNORMAL HIGH (ref 65–99)

## 2017-01-05 LAB — MAGNESIUM: MAGNESIUM: 2 mg/dL (ref 1.7–2.4)

## 2017-01-05 LAB — BRAIN NATRIURETIC PEPTIDE: B Natriuretic Peptide: 160 pg/mL — ABNORMAL HIGH (ref 0.0–100.0)

## 2017-01-05 LAB — TROPONIN I
Troponin I: 0.16 ng/mL (ref <0.03)
Troponin I: 0.2 ng/mL (ref <0.03)

## 2017-01-05 MED ORDER — JEVITY 1.2 CAL PO LIQD
1000.0000 mL | ORAL | Status: DC
  Administered 2017-01-06: 1000 mL

## 2017-01-05 MED ORDER — BACLOFEN 10 MG PO TABS
10.0000 mg | ORAL_TABLET | Freq: Three times a day (TID) | ORAL | Status: DC
  Administered 2017-01-05 – 2017-01-10 (×13): 10 mg via ORAL
  Filled 2017-01-05 (×18): qty 1

## 2017-01-05 MED ORDER — MENTHOL (TOPICAL ANALGESIC) 4 % EX GEL
1.0000 "application " | Freq: Two times a day (BID) | CUTANEOUS | Status: DC | PRN
  Filled 2017-01-05: qty 118

## 2017-01-05 MED ORDER — ONDANSETRON HCL 4 MG/2ML IJ SOLN: 4.0000 mg | Freq: Four times a day (QID) | INTRAMUSCULAR | Status: DC | PRN

## 2017-01-05 MED ORDER — IPRATROPIUM-ALBUTEROL 0.5-2.5 (3) MG/3ML IN SOLN: 3.0000 mL | Freq: Four times a day (QID) | RESPIRATORY_TRACT | Status: DC | PRN

## 2017-01-05 MED ORDER — VITAMIN D 1000 UNITS PO TABS: 5000.0000 [IU] | ORAL_TABLET | Freq: Every day | ORAL | Status: DC

## 2017-01-05 MED ORDER — DILTIAZEM HCL 100 MG IV SOLR
5.0000 mg/h | INTRAVENOUS | Status: DC
  Administered 2017-01-05: 5 mg/h via INTRAVENOUS
  Filled 2017-01-05: qty 100

## 2017-01-05 MED ORDER — ACETAMINOPHEN 650 MG RE SUPP
RECTAL | Status: AC
  Administered 2017-01-05: 650 mg via RECTAL
  Filled 2017-01-05: qty 1

## 2017-01-05 MED ORDER — SENNOSIDES-DOCUSATE SODIUM 8.6-50 MG PO TABS: 1.0000 | ORAL_TABLET | Freq: Every evening | ORAL | Status: DC | PRN

## 2017-01-05 MED ORDER — PANTOPRAZOLE SODIUM 40 MG PO PACK
40.0000 mg | PACK | Freq: Two times a day (BID) | ORAL | Status: DC
  Administered 2017-01-05 – 2017-01-10 (×9): 40 mg
  Filled 2017-01-05 (×13): qty 20

## 2017-01-05 MED ORDER — SODIUM CHLORIDE 0.9 % IV BOLUS (SEPSIS)
1000.0000 mL | INTRAVENOUS | Status: AC
  Administered 2017-01-05 (×2): 1000 mL via INTRAVENOUS

## 2017-01-05 MED ORDER — METOPROLOL TARTRATE 25 MG PO TABS
12.5000 mg | ORAL_TABLET | Freq: Two times a day (BID) | ORAL | Status: DC
  Administered 2017-01-05 – 2017-01-06 (×2): 12.5 mg via ORAL
  Filled 2017-01-05 (×4): qty 1

## 2017-01-05 MED ORDER — SODIUM CHLORIDE 0.9 % IV BOLUS (SEPSIS)
1000.0000 mL | Freq: Once | INTRAVENOUS | Status: AC
  Administered 2017-01-05: 1000 mL via INTRAVENOUS

## 2017-01-05 MED ORDER — BISACODYL 5 MG PO TBEC: 5.0000 mg | DELAYED_RELEASE_TABLET | Freq: Every day | ORAL | Status: DC | PRN

## 2017-01-05 MED ORDER — ACETAMINOPHEN 650 MG RE SUPP
650.0000 mg | Freq: Four times a day (QID) | RECTAL | Status: DC | PRN
  Filled 2017-01-05: qty 1

## 2017-01-05 MED ORDER — TRAMADOL HCL 50 MG PO TABS: 50.0000 mg | ORAL_TABLET | Freq: Two times a day (BID) | ORAL | Status: DC | PRN

## 2017-01-05 MED ORDER — METOPROLOL TARTRATE 5 MG/5ML IV SOLN
2.5000 mg | Freq: Once | INTRAVENOUS | Status: AC
  Administered 2017-01-05: 2.5 mg via INTRAVENOUS
  Filled 2017-01-05: qty 5

## 2017-01-05 MED ORDER — ACETAMINOPHEN 160 MG/5ML PO SOLN
650.0000 mg | Freq: Four times a day (QID) | ORAL | Status: DC | PRN
  Administered 2017-01-06 – 2017-01-10 (×4): 650 mg
  Filled 2017-01-05 (×6): qty 20.3

## 2017-01-05 MED ORDER — SODIUM CHLORIDE 0.9 % IV BOLUS (SEPSIS)
1000.0000 mL | Freq: Once | INTRAVENOUS | Status: AC
  Administered 2017-01-05 (×4): 250 mL via INTRAVENOUS

## 2017-01-05 MED ORDER — ADULT MULTIVITAMIN W/MINERALS CH
1.0000 | ORAL_TABLET | Freq: Every day | ORAL | Status: DC
  Administered 2017-01-06: 1 via ORAL
  Filled 2017-01-05 (×2): qty 1

## 2017-01-05 MED ORDER — SODIUM CHLORIDE 0.9 % IV SOLN
INTRAVENOUS | Status: DC
  Administered 2017-01-05 – 2017-01-07 (×3): via INTRAVENOUS

## 2017-01-05 MED ORDER — OMEPRAZOLE 2 MG/ML ORAL SUSPENSION: 40.0000 mg | Freq: Two times a day (BID) | ORAL | Status: DC

## 2017-01-05 MED ORDER — NYSTATIN 100000 UNIT/ML MT SUSP
5.0000 mL | Freq: Four times a day (QID) | OROMUCOSAL | Status: DC
  Administered 2017-01-05 – 2017-01-10 (×12): 500000 [IU] via ORAL
  Filled 2017-01-05 (×14): qty 5

## 2017-01-05 MED ORDER — ACETAMINOPHEN 325 MG PO TABS
650.0000 mg | ORAL_TABLET | Freq: Four times a day (QID) | ORAL | Status: DC | PRN
  Administered 2017-01-05: 650 mg via ORAL
  Filled 2017-01-05: qty 2

## 2017-01-05 MED ORDER — LORAZEPAM 2 MG/ML PO CONC: 1.0000 mg | Freq: Four times a day (QID) | ORAL | Status: DC | PRN

## 2017-01-05 MED ORDER — VANCOMYCIN HCL IN DEXTROSE 1-5 GM/200ML-% IV SOLN
1000.0000 mg | Freq: Once | INTRAVENOUS | Status: AC
  Administered 2017-01-05: 1000 mg via INTRAVENOUS

## 2017-01-05 MED ORDER — DEXTROSE 5 % IV SOLN
2.0000 g | Freq: Once | INTRAVENOUS | Status: AC
  Administered 2017-01-05: 2 g via INTRAVENOUS
  Filled 2017-01-05: qty 2

## 2017-01-05 MED ORDER — PIPERACILLIN-TAZOBACTAM 3.375 G IVPB 30 MIN
3.3750 g | Freq: Once | INTRAVENOUS | Status: AC
  Administered 2017-01-05: 3.375 g via INTRAVENOUS

## 2017-01-05 MED ORDER — PIPERACILLIN-TAZOBACTAM 3.375 G IVPB
3.3750 g | Freq: Three times a day (TID) | INTRAVENOUS | Status: DC
  Administered 2017-01-06 – 2017-01-10 (×12): 3.375 g via INTRAVENOUS
  Filled 2017-01-05 (×12): qty 50

## 2017-01-05 MED ORDER — FREE WATER
100.0000 mL | Freq: Three times a day (TID) | Status: DC
Start: 2017-01-05 — End: 2017-01-06

## 2017-01-05 MED ORDER — ACETAMINOPHEN 650 MG RE SUPP
650.0000 mg | Freq: Once | RECTAL | Status: AC
  Administered 2017-01-05: 650 mg via RECTAL

## 2017-01-05 MED ORDER — ONDANSETRON HCL 4 MG PO TABS: 4.0000 mg | ORAL_TABLET | Freq: Four times a day (QID) | ORAL | Status: DC | PRN

## 2017-01-05 MED ORDER — IOPAMIDOL (ISOVUE-300) INJECTION 61%
100.0000 mL | Freq: Once | INTRAVENOUS | Status: AC | PRN
  Administered 2017-01-05: 100 mL via INTRAVENOUS

## 2017-01-05 MED ORDER — PIPERACILLIN-TAZOBACTAM 3.375 G IVPB 30 MIN
INTRAVENOUS | Status: AC
  Administered 2017-01-05: 3.375 g via INTRAVENOUS
  Filled 2017-01-05: qty 50

## 2017-01-05 MED ORDER — DILTIAZEM LOAD VIA INFUSION
10.0000 mg | Freq: Once | INTRAVENOUS | Status: AC
  Administered 2017-01-05: 10 mg via INTRAVENOUS
  Filled 2017-01-05: qty 10

## 2017-01-05 MED ORDER — ATORVASTATIN CALCIUM 20 MG PO TABS
20.0000 mg | ORAL_TABLET | Freq: Every day | ORAL | Status: DC
  Administered 2017-01-05 – 2017-01-09 (×5): 20 mg via ORAL
  Filled 2017-01-05 (×5): qty 1

## 2017-01-05 MED ORDER — METRONIDAZOLE IN NACL 5-0.79 MG/ML-% IV SOLN
500.0000 mg | Freq: Three times a day (TID) | INTRAVENOUS | Status: DC
  Administered 2017-01-06 – 2017-01-08 (×7): 500 mg via INTRAVENOUS
  Filled 2017-01-05 (×11): qty 100

## 2017-01-05 MED ORDER — RIVAROXABAN 20 MG PO TABS
20.0000 mg | ORAL_TABLET | Freq: Every day | ORAL | Status: DC
  Administered 2017-01-05 – 2017-01-09 (×5): 20 mg via ORAL
  Filled 2017-01-05 (×5): qty 1

## 2017-01-05 MED ORDER — CEFTRIAXONE SODIUM-DEXTROSE 1-3.74 GM-% IV SOLR: 1.0000 g | INTRAVENOUS | Status: DC

## 2017-01-05 MED ORDER — TRAMADOL HCL 50 MG PO TABS
50.0000 mg | ORAL_TABLET | Freq: Four times a day (QID) | ORAL | Status: DC | PRN
  Administered 2017-01-06: 50 mg via ORAL
  Filled 2017-01-05 (×2): qty 1

## 2017-01-05 MED ORDER — LEVETIRACETAM 100 MG/ML PO SOLN
500.0000 mg | Freq: Two times a day (BID) | ORAL | Status: DC
  Administered 2017-01-05 – 2017-01-10 (×9): 500 mg
  Filled 2017-01-05 (×13): qty 5

## 2017-01-05 NOTE — Progress Notes (Signed)
Pharmacy Antibiotic Note  Justin Justin [name] is Justin [name] 66 y.o. male admitted on 01/05/2017 with aspiration pneumonia.  Pharmacy has been consulted for Zosyn dosing.  Plan: Patient received Ceftriaxone 2 gram IV x 1, Zosyn x 1 and Vancomycin IV x 1 in ER. Zosyn 3.375g IV q8h (4 hour infusion).     Weight: 120 lb (54.4 kg)  Temp (24hrs), Avg:102.5 F (39.2 C), Min:102 F (38.9 C), Max:103.8 F (39.9 C)   Recent Labs Lab 01/05/17 1550 01/05/17 1644 01/05/17 1846  WBC 16.4*  --   --   CREATININE  --  0.85  --   LATICACIDVEN  --  3.6* 4.4*    CrCl cannot be calculated (Unknown ideal weight.).    No Known Allergies  Antimicrobials this admission: CTX x 1 in ER     >>   Vanc x 1 in ER   >>   Zosyn 9/14 >>  Dose adjustments this admission:    Microbiology results: 9/14 BCx: Pend 9/14 UCx: Pend    Sputum:      MRSA PCR:   CT chest: Right lower lobe pneumonia and collapse  Thank you for allowing pharmacy to be Justin [name] part of this patient's care.  Justin Justin [name] 01/05/2017 8:07 PM

## 2017-01-05 NOTE — ED Notes (Signed)
Called to check in status of metabolic panel, per lab it is currently in machine

## 2017-01-05 NOTE — ED Notes (Signed)
Attempted report x1 to [facility], she stated they are talking to [facility] regarding patient placement and they will call me back.

## 2017-01-05 NOTE — H&P (Signed)
[facility] [place] [facility]   PATIENT NAME: Justin [name]    MR#:  [id number]  DATE OF BIRTH:  1951/01/22  DATE OF ADMISSION:  01/05/2017  PRIMARY CARE PHYSICIAN: Dimple Casey, MD   REQUESTING/REFERRING PHYSICIAN: dr Darnelle Catalan  CHIEF COMPLAINT:   Fever And weakness HISTORY OF PRESENT ILLNESS:  Justin [name]  is a 66 y.o. 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 In the emergency room who presents with a fever of 103 and elevated lactic acid level. Patient is nonverbal at baseline due to previous CVA.   He was given broad-spectrum antibiotics CT scan and chest x-ray is consistent with right middle lobe pneumonia. His lactic acid level continues to increase His heart rates are also elevated up to the 150s. Upon arrival to ER his heart rate was 152 and he was tachypnea.  History of present illness is taken from ED physician as well as sister who is at bedside.  PAST MEDICAL HISTORY:   Past Medical History:  Diagnosis Date  . Atrial fibrillation (HCC)   . Hyperlipidemia   . Hypertension   . Stroke [facility])     PAST SURGICAL HISTORY:  History reviewed. No pertinent surgical history.  SOCIAL HISTORY:   Social History  Substance Use Topics  . Smoking status: Never Smoker  . Smokeless tobacco: Never Used  . Alcohol use No    FAMILY HISTORY:   Family History  Problem Relation Age of Onset  . Hypertension Other     DRUG ALLERGIES:  No Known Allergies  REVIEW OF SYSTEMS:   Review of Systems  Unable to perform ROS: Other    MEDICATIONS AT HOME:   Prior to Admission medications   Medication Sig Start Date End Date Taking? Authorizing Provider  acetaminophen (TYLENOL) 325 MG tablet 650 mg by Gastric Tube route every 4 (four) hours as needed  for mild pain or moderate pain.   Yes [provider]  atorvastatin (LIPITOR) 20 MG tablet Take 20 mg by mouth at bedtime.    Yes [provider]  baclofen (LIORESAL) 10 MG tablet Take 10 mg by mouth 3 (three) times daily.   Yes [provider]  cefTRIAXone (ROCEPHIN) 1-3.74 GM-% IVPB Inject 1 g into the vein daily. 01/05/17 01/14/17 Yes [provider]  Cholecalciferol (VITAMIN D3) 5000 units TABS 5,000 Units by Gastric Tube route daily.   Yes [provider]  ipratropium-albuterol (DUONEB) 0.5-2.5 (3) MG/3ML SOLN Take 3 mLs by nebulization every 6 (six) hours as needed.   Yes [provider]  levETIRAcetam (KEPPRA) 100 MG/ML solution Place 500 mg into feeding tube 2 (two) times daily.   Yes [provider]  LORazepam (ATIVAN) 2 MG/ML concentrated solution Take 1 mg by mouth every 6 (six) hours as needed for seizure.   Yes [provider]  Menthol, Topical Analgesic, (BIOFREEZE) 4 % GEL Apply 1 application topically 2 (two) times daily. Apply to left shoulder and elbow for arthritis.   Yes [provider]  metoprolol tartrate (LOPRESSOR) 25 MG tablet Take 12.5 mg by mouth 2 (two) times daily.   Yes [provider]  metroNIDAZOLE (FLAGYL) 500 MG tablet Take 500 mg by mouth every 8 (eight) hours. 01/05/17 01/19/17 Yes [provider]  Multiple Vitamins-Minerals (MULTIVITAMIN WITH MINERALS) tablet Take 1 tablet by mouth  daily.   Yes [provider]  omeprazole (PRILOSEC) 2 mg/mL SUSP Take 40 mg by mouth every 12 (twelve) hours.   Yes [provider]  rivaroxaban (XARELTO) 20 MG TABS tablet Take 20 mg by mouth daily with supper.   Yes [provider]  amoxicillin-clavulanate (AUGMENTIN) 400-57 MG/5ML suspension Take 10.9 mLs (875 mg total) by mouth every 12 (twelve) hours. Continue for four more doses Patient not taking: Reported on 01/05/2017 10/02/15   Alford Highland, MD  apixaban  (ELIQUIS) 5 MG TABS tablet Take 1 tablet (5 mg total) by mouth 2 (two) times daily. Patient not taking: Reported on 01/05/2017 10/01/15   [name], Cletis Athens, MD  Nutritional Supplements (FEEDING SUPPLEMENT, JEVITY 1.2 CAL,) LIQD Place 1,000 mLs into feeding tube continuous. 75ml per hour Patient not taking: Reported on 01/05/2017 10/02/15   Alford Highland, MD  nystatin (MYCOSTATIN) 100000 UNIT/ML suspension Take 5 mLs (500,000 Units total) by mouth 4 (four) times daily. Patient not taking: Reported on 01/05/2017 10/02/15   Alford Highland, MD  traMADol (ULTRAM) 50 MG tablet Take 1 tablet (50 mg total) by mouth every 12 (twelve) hours as needed for moderate pain. Patient not taking: Reported on 01/05/2017 10/01/15   [name], Cletis Athens, MD  Water For Irrigation, Sterile (FREE WATER) SOLN Place 100 mLs into feeding tube every 8 (eight) hours. Patient not taking: Reported on 01/05/2017 10/01/15   [name], Cletis Athens, MD      VITAL SIGNS:  Blood pressure 107/67, pulse 73, temperature (!) 102 F (38.9 C), resp. rate (!) 38, weight 54.4 kg (120 lb), SpO2 100 %.  PHYSICAL EXAMINATION:   Physical Exam  Constitutional: He is oriented to person, place, and time and well-developed, well-nourished, and in no distress. No distress.  Ill appearing  HENT:  Head: Normocephalic.  Eyes: No scleral icterus.  Neck: Normal range of motion. Neck supple. No JVD present. No tracheal deviation present.  Cardiovascular: Normal heart sounds.  Exam reveals no gallop and no friction rub.   No murmur heard. Irregular, irregular tachycardia  Pulmonary/Chest: Effort normal. No respiratory distress. He has no wheezes. He has no rales. He exhibits no tenderness.  Decreased breath sounds right base  Abdominal: Soft. Bowel sounds are normal. He exhibits no distension and no mass. There is no tenderness. There is no rebound and no guarding.  PEG tube placed without discharge  Musculoskeletal: He exhibits tenderness. He exhibits no edema or  deformity.  Right leg contracted  Neurological: He is alert and oriented to person, place, and time.  Skin: Skin is warm. No rash noted. He is diaphoretic. No erythema.  Psychiatric:  Nonverbal at baseline      LABORATORY PANEL:   CBC  Recent Labs Lab 01/05/17 1550  WBC 16.4*  HGB 16.7  HCT 50.1  PLT 226   ------------------------------------------------------------------------------------------------------------------  Chemistries   Recent Labs Lab 01/05/17 1644  NA 148*  K 3.5  CL 118*  CO2 21*  GLUCOSE 256*  BUN 32*  CREATININE 0.85  CALCIUM 7.5*  AST 68*  ALT 82*  ALKPHOS 55  BILITOT 0.8   ------------------------------------------------------------------------------------------------------------------  Cardiac Enzymes  Recent Labs Lab 01/05/17 1644  TROPONINI 0.16*   ------------------------------------------------------------------------------------------------------------------  RADIOLOGY:  Ct Head Wo Contrast  Result Date: 01/05/2017 CLINICAL DATA:  Worsening of mental status. Fever. Tachycardia. Rapid breathing. EXAM: CT HEAD WITHOUT CONTRAST TECHNIQUE: Contiguous axial images were obtained from the base of the skull through the vertex without intravenous contrast. COMPARISON:  09/25/2015 FINDINGS: Brain: Generalized  brain atrophy. Extensive old ischemic changes. Old right cerebellar infarction. Old right frontal and insular infarction. Old right frontoparietal vertex infarction. Old left frontal and insular infarction. Old left temporal infarction. Old left occipital infarction. Old left parietal and posterior frontal infarction. No evidence of hemorrhage, hydrocephalus or extra-axial collection. Vascular: No abnormal vascular finding. Skull: Negative Sinuses/Orbits: Sinuses are clear. Old right medial orbital blowout fracture. No acute orbital finding. Other: None IMPRESSION: No acute finding by CT extensive old brain infarctions scattered throughout  as outlined above. Electronically Signed   By: Paulina Fusi M.D.   On: 01/05/2017 18:45   Ct Chest W Contrast  Result Date: 01/05/2017 CLINICAL DATA:  Worsening mental status.  Fever.  Rapid breathing. EXAM: CT CHEST WITH CONTRAST TECHNIQUE: Multidetector CT imaging of the chest was performed during intravenous contrast administration. CONTRAST:  ISOVUE-300 IOPAMIDOL (ISOVUE-300) INJECTION 61% COMPARISON:  Chest radiography same day FINDINGS: Cardiovascular: Aortic atherosclerosis. No aneurysm or dissection. No visible pulmonary emboli. The heart is not enlarged. No coronary artery calcification is seen. Mediastinum/Nodes: Prominence of the right hilar and paratracheal nodes, felt most likely reactive. See below discussion. Lungs/Pleura: Left lung is clear. There is right lower lobe collapse and pneumonia. Right hilar and paratracheal nodes are fell reactive to the right lower lobe pneumonia. Cannot rule out the possibility of underlying tumor with metastasis. Consider repeat examination after treatment if appropriate. Upper Abdomen: See results of abdominal study. Musculoskeletal: Negative IMPRESSION: Right lower lobe pneumonia and collapse. Enlargement of the right hilar and paratracheal nodes, most likely reactive to the right lower lobe pneumonia. I would suggest follow-up after treatment, if clinically appropriate, as underlying malignancy can't be excluded. Electronically Signed   By: Paulina Fusi M.D.   On: 01/05/2017 18:49   Ct Abdomen Pelvis W Contrast  Result Date: 01/05/2017 CLINICAL DATA:  Fever.  Rapid breathing.  Worsening mental status. EXAM: CT ABDOMEN AND PELVIS WITH CONTRAST TECHNIQUE: Multidetector CT imaging of the abdomen and pelvis was performed using the standard protocol following bolus administration of intravenous contrast. CONTRAST:  ISOVUE-300 IOPAMIDOL (ISOVUE-300) INJECTION 61% COMPARISON:  09/26/2015 FINDINGS: Lower chest: See results of chest CT.  Right lower lobe  pneumonia. Hepatobiliary: Negative Pancreas: Normal Spleen: Normal Adrenals/Urinary Tract: Adrenal glands are normal. Kidneys are normal. No bladder abnormality seen. Catheter within the bladder. Stomach/Bowel: Gastrostomy tube in place. No evidence of ileus or obstruction. No focal bowel lesion. Vascular/Lymphatic: Aortic atherosclerosis. No aneurysm. IVC is normal. No retroperitoneal adenopathy. Reproductive: Normal Other: No free fluid or air. Musculoskeletal: Negative.  Ordinary lumbar degenerative changes. IMPRESSION: No acute abdominal finding. Gastrostomy in place. Foley catheter in place. Electronically Signed   By: Paulina Fusi M.D.   On: 01/05/2017 18:52   Dg Chest Portable 1 View  Result Date: 01/05/2017 CLINICAL DATA:  Altered mental status and fever. EXAM: PORTABLE CHEST 1 VIEW COMPARISON:  09/25/2015 FINDINGS: Mild cardiomegaly. Chronic aortic atherosclerosis. No evidence heart failure or effusion. The left lung is clear. Question medial right lower lobe pneumonia, based on asymmetric retrocardiac density. Consider two-view radiography. IMPRESSION: Suspicion of medial right lower lobe pneumonia. Cardiomegaly and aortic atherosclerosis. Electronically Signed   By: Paulina Fusi M.D.   On: 01/05/2017 17:57    EKG:  Atrial fibrillation with heart rate 149  IMPRESSION AND PLAN:   66 year old male with history of CVA and left-sided residual deficit who is nonverbal at baseline and has PEG tube for feedings presents for [facility] due to fevers and generalized weakness.  1.  Severe sepsis with tachycardia, increasing lactic acid level and high fevers Sepsis is due to aspiration pneumonia Continue IV fluids Monitor lactic acid level Patient will be admitted to stepdown unit. Case discussed with intensivist on-call   2. Aspiration pneumonia: Continue Zosyn Aspiration precautions Speech consultation  3. History CVA with left-sided deficits and nonverbal at baseline with PEG tube  feedings Dietary consultation requested for PEG feedings Continue atorvastatin  4. Atrial fibrillation with RVR: Start IV diltiazem gtt Continue anticoagulation with XARELTO  5. History of seizure disorder: Continue Keppra.    All the records are reviewed and case discussed with ED provider. Management plans discussed with the patient's sister and she in agreement  CODE STATUS: DNR  Critical care TOTAL TIME TAKING CARE OF THIS PATIENT: 55 minutes.    Marlee Trentman M.D on 01/05/2017 at 7:35 PM  Between 7am to 6pm - Pager - [phone]  After 6pm go to [email or web address] - password [employer]  [facility]  Office  [phone]  CC: Primary care physician; Dimple Casey, MD

## 2017-01-05 NOTE — Progress Notes (Signed)
CRITICAL VALUE ALERT  Critical Value:  Lactic acid  Date & Time Notied:  01/05/17 @ 11:20 pm  Provider Notified: Brendolyn Patty  Orders Received/Actions taken: No new orders at this time

## 2017-01-05 NOTE — ED Notes (Signed)
Date and time results received: 01/05/17 1938  Test: Lactic Acid Critical Value: 4.4  Name of Provider Notified: Malinda  Orders Received? Or Actions Taken?: Give patient bolus spread over x4.  Monitor patient O2 Sats

## 2017-01-05 NOTE — ED Notes (Signed)
Pt transported to CT ?

## 2017-01-05 NOTE — ED Triage Notes (Signed)
Pt brought in by [facility] from [facility] for fever and decline in status. Per EMS staff reports that pt has been having a general decline in his status throughout the day. Pt has also been running fever today.  Upon arrival to ED pt is tachycardic at 152 and tachypneic at 41.

## 2017-01-05 NOTE — ED Provider Notes (Signed)
[facility] Emergency Department Provider Note   ____________________________________________   First MD Initiated Contact with Patient 01/05/17 1555     (approximate)  I have reviewed the triage vital signs and the nursing notes.   HISTORY  Chief Complaint Code Sepsis  History limited by patient's being nonverbal and septic  HPI Justin [name] is a 66 y.o. male Patient comes from [facility] with a 1 or 2 day decline in mental status. He had a fever of 103 today was sent into the emergency room. On arrival here he would look at you but would not nod or shake his head as he usually does. Histachycardic up to 160 and tachypnea To 41. Patient did not seem to be coughing and could not give any history.  Past Medical History:  Diagnosis Date  . Atrial fibrillation (HCC)   . Hyperlipidemia   . Hypertension   . Stroke [facility])     Patient Active Problem List   Diagnosis Date Noted  . Gross hematuria   . Severe sepsis (HCC) 09/25/2015    History reviewed. No pertinent surgical history.  Prior to Admission medications   Medication Sig Start Date End Date Taking? Authorizing Provider  acetaminophen (TYLENOL) 325 MG tablet 650 mg by Gastric Tube route every 4 (four) hours as needed for mild pain or moderate pain.   Yes [provider]  atorvastatin (LIPITOR) 20 MG tablet Take 20 mg by mouth at bedtime.    Yes [provider]  baclofen (LIORESAL) 10 MG tablet Take 10 mg by mouth 3 (three) times daily.   Yes [provider]  cefTRIAXone (ROCEPHIN) 1-3.74 GM-% IVPB Inject 1 g into the vein daily. 01/05/17 01/14/17 Yes [provider]  Cholecalciferol (VITAMIN D3) 5000 units TABS 5,000 Units by Gastric Tube route daily.   Yes [provider]  ipratropium-albuterol (DUONEB) 0.5-2.5 (3) MG/3ML SOLN Take 3 mLs by nebulization every 6 (six) hours as needed.   Yes [provider]  levETIRAcetam (KEPPRA) 100  MG/ML solution Place 500 mg into feeding tube 2 (two) times daily.   Yes [provider]  LORazepam (ATIVAN) 2 MG/ML concentrated solution Take 1 mg by mouth every 6 (six) hours as needed for seizure.   Yes [provider]  Menthol, Topical Analgesic, (BIOFREEZE) 4 % GEL Apply 1 application topically 2 (two) times daily. Apply to left shoulder and elbow for arthritis.   Yes [provider]  metoprolol tartrate (LOPRESSOR) 25 MG tablet Take 12.5 mg by mouth 2 (two) times daily.   Yes [provider]  metroNIDAZOLE (FLAGYL) 500 MG tablet Take 500 mg by mouth every 8 (eight) hours. 01/05/17 01/19/17 Yes [provider]  Multiple Vitamins-Minerals (MULTIVITAMIN WITH MINERALS) tablet Take 1 tablet by mouth daily.   Yes [provider]  omeprazole (PRILOSEC) 2 mg/mL SUSP Take 40 mg by mouth every 12 (twelve) hours.   Yes [provider]  rivaroxaban (XARELTO) 20 MG TABS tablet Take 20 mg by mouth daily with supper.   Yes [provider]  amoxicillin-clavulanate (AUGMENTIN) 400-57 MG/5ML suspension Take 10.9 mLs (875 mg total) by mouth every 12 (twelve) hours. Continue for four more doses Patient not taking: Reported on 01/05/2017 10/02/15   Alford Highland, MD  apixaban (ELIQUIS) 5 MG TABS tablet Take 1 tablet (5 mg total) by mouth 2 (two) times daily. Patient not taking: Reported on 01/05/2017 10/01/15   [name], Cletis Athens, MD  Nutritional Supplements (FEEDING SUPPLEMENT, JEVITY 1.2  CAL,) LIQD Place 1,000 mLs into feeding tube continuous. 75ml per hour Patient not taking: Reported on 01/05/2017 10/02/15   Alford Highland, MD  nystatin (MYCOSTATIN) 100000 UNIT/ML suspension Take 5 mLs (500,000 Units total) by mouth 4 (four) times daily. Patient not taking: Reported on 01/05/2017 10/02/15   Alford Highland, MD  traMADol (ULTRAM) 50 MG tablet Take 1 tablet (50 mg total) by mouth every 12 (twelve) hours as needed for moderate pain. Patient not  taking: Reported on 01/05/2017 10/01/15   [name], Cletis Athens, MD  Water For Irrigation, Sterile (FREE WATER) SOLN Place 100 mLs into feeding tube every 8 (eight) hours. Patient not taking: Reported on 01/05/2017 10/01/15   [name], Cletis Athens, MD    Allergies Patient has no known allergies.  Family History  Problem Relation Age of Onset  . Hypertension Other     Social History Social History  Substance Use Topics  . Smoking status: Never Smoker  . Smokeless tobacco: Never Used  . Alcohol use No    Review of Systems unobtainable ____________________________________________   PHYSICAL EXAM:  VITAL SIGNS: ED Triage Vitals  Enc Vitals Group     BP 01/05/17 1551 (!) 128/107     Pulse Rate 01/05/17 1700 (!) 110     Resp 01/05/17 1551 (!) 41     Temp 01/05/17 1620 (!) 103.2 F (39.6 C)     Temp src --      SpO2 01/05/17 1543 96 %     Weight 01/05/17 1546 120 lb (54.4 kg)     Height --      Head Circumference --      Peak Flow --      Pain Score --      Pain Loc --      Pain Edu? --      Excl. in GC? --     Constitutional: Alert  Eyes: Conjunctivae are normal. Head: Atraumatic. Nose: No congestion/rhinnorhea. Mouth/Throat: Mucous membranes are moist.  Oropharynx non-erythematous. Neck: No stridor.no palpable nodes.patient's neck may be stiff  Cardiovascular: rapid rate, regular rhythm. Grossly normal heart sounds.  Good peripheral circulation. Respiratory: Normal respiratory effort.  No retractions. Lungs CTAB. Gastrointestinal: Soft possibly tender patient seems to wince slightly on palpation of the right lower quadrant very difficult to tell however No distention. No abdominal bruits. No CVA tenderness. Musculoskeletal: No lower extremity tenderness nor edema.  No joint effusions.patient keeps the right leg flexed and outweirdly rotated. Winces more when you try to straighten the leg or rotated inward. Neurologic:  Normal speech and language. No gross focal neurologic deficits  are appreciated. No gait instability. Skin:  Skin is warm, dry and intact. No rash noted.   ____________________________________________   LABS (all labs ordered are listed, but only abnormal results are displayed)  Labs Reviewed  CBC WITH DIFFERENTIAL/PLATELET - Abnormal; Notable for the following:       Result Value   WBC 16.4 (*)    RDW 14.8 (*)    Neutro Abs 14.0 (*)    Monocytes Absolute 1.3 (*)    All other components within normal limits  URINALYSIS, ROUTINE W REFLEX MICROSCOPIC - Abnormal; Notable for the following:    Color, Urine AMBER (*)    APPearance HAZY (*)    Specific Gravity, Urine 1.038 (*)    Ketones, ur 5 (*)    Protein, ur 100 (*)    All other components within normal limits  BLOOD GAS, VENOUS - Abnormal; Notable for the following:  pCO2, Ven 41 (*)    pO2, Ven <31.0 (*)    All other components within normal limits  COMPREHENSIVE METABOLIC PANEL - Abnormal; Notable for the following:    Sodium 148 (*)    Chloride 118 (*)    CO2 21 (*)    Glucose, Bld 256 (*)    BUN 32 (*)    Calcium 7.5 (*)    Total Protein 6.4 (*)    Albumin 2.5 (*)    AST 68 (*)    ALT 82 (*)    All other components within normal limits  TROPONIN I - Abnormal; Notable for the following:    Troponin I 0.16 (*)    All other components within normal limits  BRAIN NATRIURETIC PEPTIDE - Abnormal; Notable for the following:    B Natriuretic Peptide 160.0 (*)    All other components within normal limits  LACTIC ACID, PLASMA - Abnormal; Notable for the following:    Lactic Acid, Venous 3.6 (*)    All other components within normal limits  CULTURE, BLOOD (ROUTINE X 2)  CULTURE, BLOOD (ROUTINE X 2)  URINE CULTURE  LACTIC ACID, PLASMA   ____________________________________________  EKG  EKG read and interpreted by me shows an ectopic atrial tachycardia at a rate of 149 normal axisnonspecific ST-T wave changes QTC is 514 by the  computer ____________________________________________  RADIOLOGY  IMPRESSION: No acute abdominal finding. Gastrostomy in place. Foley catheter in place.   Electronically Signed   By: Paulina Fusi M.D.   On: 01/05/2017 18:52 IMPRESSION: Right lower lobe pneumonia and collapse. Enlargement of the right hilar and paratracheal nodes, most likely reactive to the right lower lobe pneumonia. I would suggest follow-up after treatment, if clinically appropriate, as underlying malignancy can't be excluded.   Electronically Signed   By: Paulina Fusi M.D.  IMPRESSION: No acute finding by CT extensive old brain infarctions scattered throughout as outlined above.   Electronically Signed   By: Paulina Fusi M.D. ____________________________________________   PROCEDURES  Procedure(s) performedfluids and antibiotics were started patient's heart rate drops to 110no obvious source that'll attempt to CT him his blood pressures remained stable in the 120s  Procedures  Critical Care performed:  ____________________________________________   INITIAL IMPRESSION / ASSESSMENT AND PLAN / ED COURSE  Pertinent labs & imaging results that were available during my care of the patient were reviewed by me and considered in my medical decision making (see chart for details).  At 6:30 patient is now more awake and alert he's nodding yes or no or shaking his head yes or no and should say to questions. He says he has a headache but his neck is not stiff he does not appear to have any other problems and he is apparently at his baseline.      ____________________________________________   FINAL CLINICAL IMPRESSION(S) / ED DIAGNOSES  Final diagnoses:  Sepsis, due to unspecified organism (HCC)  Healthcare-associated pneumonia      NEW MEDICATIONS STARTED DURING THIS VISIT:  New Prescriptions   No medications on file     Note:  This document was prepared using Dragon voice  recognition software and may include unintentional dictation errors.    Arnaldo Natal, MD 01/05/17 [phone]

## 2017-01-05 NOTE — Consult Note (Addendum)
PULMONARY / CRITICAL CARE MEDICINE    Pulmonary/critical care attending  I have personally seen and examined Mr. Justin [name], reviewed revised and confirm nurse practitioner's note. I have personally performed history and physical, reviewed laboratory studies and imaging studies.  In short,patient is a 66 y/o AA male, SNF resident who presented to the ED with c/o AMS and fever. Patient was sent to the ED for declining mentation and fever x 1 Justin [name]. Upon arrival in the ED, patient was tachycardic with a HR 152, febrile with a temp of 103.8 degrees F and tachypneic at 41 breaths/min. ED worked shoed lactic acidosis, RLL pneumonia and a WBC of 16K.   Sepsis. Multifactorial etiology to include C. Difficile colitis, right-sided pneumonia with reactive lymphadenopathy. Presently being treated with vancomycin and Zosyn  Hypernatremia. Has received 4.2 L in, 1.4 L out net +2.8 L. Still volume contracted, will continue resuscitation  Lactic acidosis. Lactic acid has significantly improved from 4.4 to 2.6 with volume resuscitation and antibiotic coverage.  Leukocytosis. Has received cultures, on broad-spectrum coverage at this time  Positive troponin at 0.2. Most likely reflects supply demand ischemia, no clear ischemic changes on EKG.  We'll monitor in intensive care unit, if hemodynamics remain stable will transfer to the floor  Tora Kindred, D.O.       Name: Justin [name] MRN: [id number] DOB: 1950/09/13    ADMISSION DATE:  01/05/2017   CONSULTATION DATE:  01/05/17  REFERRING MD:  Dr. Juliene Pina  REASON: Sepsis and pneumonia  CHIEF COMPLAINT:  AMS and fever  HISTORY OF PRESENT ILLNESS:   This is a 66 y/o AA male, SNF resident who presented to the ED with c/o AMS and fever. Patient is non-verbal hence history is obtained from ED records. Patient was sent to the ED for declining mentation and fever x 1 Justin [name]. Upon arrival in the ED, patient was tachycardic with a HR 152, febrile with a temp of 103.8  degrees F and tachypneic at 41 breaths/min. He was not hypoxic. ED worked shoed lactic acidosis, RLL pneumonia and a WBC of 16K. Patient was admitted to the SDU for further management. Upon arrival in the ICU, patient had 3 loose stools; no blood. He is awake and will occasionally nod to questions. He remains on RA and his blood pressure is normal.   PAST MEDICAL HISTORY :  He  has a past medical history of Atrial fibrillation (HCC); Hyperlipidemia; Hypertension; and Stroke [facility]).  PAST SURGICAL HISTORY: He  has no past surgical history on file.  No Known Allergies  No current facility-administered medications on file prior to encounter.    Current Outpatient Prescriptions on File Prior to Encounter  Medication Sig  . acetaminophen (TYLENOL) 325 MG tablet 650 mg by Gastric Tube route every 4 (four) hours as needed for mild pain or moderate pain.  Marland Kitchen atorvastatin (LIPITOR) 20 MG tablet Take 20 mg by mouth at bedtime.   . baclofen (LIORESAL) 10 MG tablet Take 10 mg by mouth 3 (three) times daily.  . Cholecalciferol (VITAMIN D3) 5000 units TABS 5,000 Units by Gastric Tube route daily.  . Menthol, Topical Analgesic, (BIOFREEZE) 4 % GEL Apply 1 application topically 2 (two) times daily. Apply to left shoulder and elbow for arthritis.  . metoprolol tartrate (LOPRESSOR) 25 MG tablet Take 12.5 mg by mouth 2 (two) times daily.  . Multiple Vitamins-Minerals (MULTIVITAMIN WITH MINERALS) tablet Take 1 tablet by mouth daily.  Marland Kitchen omeprazole (PRILOSEC) 2 mg/mL SUSP Take 40 mg by mouth every 12 (  twelve) hours.  Marland Kitchen amoxicillin-clavulanate (AUGMENTIN) 400-57 MG/5ML suspension Take 10.9 mLs (875 mg total) by mouth every 12 (twelve) hours. Continue for four more doses (Patient not taking: Reported on 01/05/2017)  . apixaban (ELIQUIS) 5 MG TABS tablet Take 1 tablet (5 mg total) by mouth 2 (two) times daily. (Patient not taking: Reported on 01/05/2017)  . Nutritional Supplements (FEEDING SUPPLEMENT, JEVITY 1.2 CAL,)  LIQD Place 1,000 mLs into feeding tube continuous. 75ml per hour (Patient not taking: Reported on 01/05/2017)  . nystatin (MYCOSTATIN) 100000 UNIT/ML suspension Take 5 mLs (500,000 Units total) by mouth 4 (four) times daily. (Patient not taking: Reported on 01/05/2017)  . traMADol (ULTRAM) 50 MG tablet Take 1 tablet (50 mg total) by mouth every 12 (twelve) hours as needed for moderate pain. (Patient not taking: Reported on 01/05/2017)  . Water For Irrigation, Sterile (FREE WATER) SOLN Place 100 mLs into feeding tube every 8 (eight) hours. (Patient not taking: Reported on 01/05/2017)    FAMILY HISTORY:  His indicated that the status of his other is unknown.    SOCIAL HISTORY: He  reports that he has never smoked. He has never used smokeless tobacco. He reports that he does not drink alcohol.  REVIEW OF SYSTEMS:   Unable to obtain as patient is non-verbal  SUBJECTIVE:   VITAL SIGNS: BP 113/67   Pulse (!) 134   Temp (!) 101.5 F (38.6 C)   Resp (!) 36   Ht  (1.676 m)   Wt 143 lb 4.8 oz (65 kg)   SpO2 98%   BMI 23.13 kg/m   HEMODYNAMICS:    VENTILATOR SETTINGS:    INTAKE / OUTPUT: I/O last 3 completed shifts: In: 250 [IV Piggyback:250] Out: -   PHYSICAL EXAMINATION: General: chronically ill looking Neuro:  Awake, aphasic, right hemiparesis HEENT:  PERRLA, oral mucosa dry, tongue with white patches, lips dry  Cardiovascular:  Tachycardic, irregular, S1/S2, no MRG, +2 pulses, no edema Lungs:  Normal WOB, bilateral breath sounds, diminished in the bases, rt>>left, no wheezing Abdomen:  Non-distended, G-Tube in place with mild purulent drainage around insertion site Musculoskeletal:  Contractures in upper and lower extremities, no swelling Skin: febrile to touch, dry, no lesions, feet dry   LABS:  BMET  Recent Labs Lab 01/05/17 1644  NA 148*  K 3.5  CL 118*  CO2 21*  BUN 32*  CREATININE 0.85  GLUCOSE 256*    Electrolytes  Recent Labs Lab 01/05/17 1644  01/05/17 2208  CALCIUM 7.5*  --   MG  --  2.0  PHOS  --  2.6    CBC  Recent Labs Lab 01/05/17 1550  WBC 16.4*  HGB 16.7  HCT 50.1  PLT 226    Coag's No results for input(s): APTT, INR in the last 168 hours.  Sepsis Markers  Recent Labs Lab 01/05/17 1644 01/05/17 1846 01/05/17 2208  LATICACIDVEN 3.6* 4.4* 2.9*  PROCALCITON  --   --  0.69    ABG No results for input(s): PHART, PCO2ART, PO2ART in the last 168 hours.  Liver Enzymes  Recent Labs Lab 01/05/17 1644  AST 68*  ALT 82*  ALKPHOS 55  BILITOT 0.8  ALBUMIN 2.5*    Cardiac Enzymes  Recent Labs Lab 01/05/17 1644 01/05/17 2208  TROPONINI 0.16* 0.20*    Glucose  Recent Labs Lab 01/05/17 2115  GLUCAP 100*    Imaging Ct Head Wo Contrast  Result Date: 01/05/2017 CLINICAL DATA:  Worsening of mental status. Fever. Tachycardia. Rapid  breathing. EXAM: CT HEAD WITHOUT CONTRAST TECHNIQUE: Contiguous axial images were obtained from the base of the skull through the vertex without intravenous contrast. COMPARISON:  09/25/2015 FINDINGS: Brain: Generalized brain atrophy. Extensive old ischemic changes. Old right cerebellar infarction. Old right frontal and insular infarction. Old right frontoparietal vertex infarction. Old left frontal and insular infarction. Old left temporal infarction. Old left occipital infarction. Old left parietal and posterior frontal infarction. No evidence of hemorrhage, hydrocephalus or extra-axial collection. Vascular: No abnormal vascular finding. Skull: Negative Sinuses/Orbits: Sinuses are clear. Old right medial orbital blowout fracture. No acute orbital finding. Other: None IMPRESSION: No acute finding by CT extensive old brain infarctions scattered throughout as outlined above. Electronically Signed   By: Paulina Fusi M.D.   On: 01/05/2017 18:45   Ct Chest W Contrast  Result Date: 01/05/2017 CLINICAL DATA:  Worsening mental status.  Fever.  Rapid breathing. EXAM: CT CHEST WITH  CONTRAST TECHNIQUE: Multidetector CT imaging of the chest was performed during intravenous contrast administration. CONTRAST:  ISOVUE-300 IOPAMIDOL (ISOVUE-300) INJECTION 61% COMPARISON:  Chest radiography same Justin [name] FINDINGS: Cardiovascular: Aortic atherosclerosis. No aneurysm or dissection. No visible pulmonary emboli. The heart is not enlarged. No coronary artery calcification is seen. Mediastinum/Nodes: Prominence of the right hilar and paratracheal nodes, felt most likely reactive. See below discussion. Lungs/Pleura: Left lung is clear. There is right lower lobe collapse and pneumonia. Right hilar and paratracheal nodes are fell reactive to the right lower lobe pneumonia. Cannot rule out the possibility of underlying tumor with metastasis. Consider repeat examination after treatment if appropriate. Upper Abdomen: See results of abdominal study. Musculoskeletal: Negative IMPRESSION: Right lower lobe pneumonia and collapse. Enlargement of the right hilar and paratracheal nodes, most likely reactive to the right lower lobe pneumonia. I would suggest follow-up after treatment, if clinically appropriate, as underlying malignancy can't be excluded. Electronically Signed   By: Paulina Fusi M.D.   On: 01/05/2017 18:49   Ct Abdomen Pelvis W Contrast  Result Date: 01/05/2017 CLINICAL DATA:  Fever.  Rapid breathing.  Worsening mental status. EXAM: CT ABDOMEN AND PELVIS WITH CONTRAST TECHNIQUE: Multidetector CT imaging of the abdomen and pelvis was performed using the standard protocol following bolus administration of intravenous contrast. CONTRAST:  ISOVUE-300 IOPAMIDOL (ISOVUE-300) INJECTION 61% COMPARISON:  09/26/2015 FINDINGS: Lower chest: See results of chest CT.  Right lower lobe pneumonia. Hepatobiliary: Negative Pancreas: Normal Spleen: Normal Adrenals/Urinary Tract: Adrenal glands are normal. Kidneys are normal. No bladder abnormality seen. Catheter within the bladder. Stomach/Bowel: Gastrostomy  tube in place. No evidence of ileus or obstruction. No focal bowel lesion. Vascular/Lymphatic: Aortic atherosclerosis. No aneurysm. IVC is normal. No retroperitoneal adenopathy. Reproductive: Normal Other: No free fluid or air. Musculoskeletal: Negative.  Ordinary lumbar degenerative changes. IMPRESSION: No acute abdominal finding. Gastrostomy in place. Foley catheter in place. Electronically Signed   By: Paulina Fusi M.D.   On: 01/05/2017 18:52   Dg Chest Portable 1 View  Result Date: 01/05/2017 CLINICAL DATA:  Altered mental status and fever. EXAM: PORTABLE CHEST 1 VIEW COMPARISON:  09/25/2015 FINDINGS: Mild cardiomegaly. Chronic aortic atherosclerosis. No evidence heart failure or effusion. The left lung is clear. Question medial right lower lobe pneumonia, based on asymmetric retrocardiac density. Consider two-view radiography. IMPRESSION: Suspicion of medial right lower lobe pneumonia. Cardiomegaly and aortic atherosclerosis. Electronically Signed   By: Paulina Fusi M.D.   On: 01/05/2017 17:57    STUDIES:  None  CULTURES: Blood x 2 Urine Stool for c-diff GI panel MRSA screen ANTIBIOTICS:  Vancomycin> Zosyn> Flagyl> Ceftriaxone given X 1  SIGNIFICANT EVENTS: 9/14>ADMITTED  LINES/TUBES: G-Tube PIVs Foley  DISCUSSION: 66 y/o AA male admitted from SNF with sepsis, HCAP and diarrhea  ASSESSMENT  Sepsis 2/2 PNA RLL pneumonia Diarrhea Acute metabolic encephalopathy Afib with RVR Oral thrush History of: CVA, Seizure, hypertension and hyperlipidemia  PLAN Hemodynamics per ICU protocol IV Abx as above F/U cultures IV fluids Stool for GI panel and c-diff Trend Procalcitonin Trend troponin Continue home dose of statin, xarelto, metoprolol, keppra, oral nystatin, and baclofen Prn morphine for pain  FAMILY  - Updates: no family at bedside. Will update when available.  - Inter-disciplinary family meet or Palliative Care meeting due by:  Justin [name] 7  [facility]  ANP-BC Pulmonary and Critical Care Medicine [facility] Pager [phone] or [phone]  01/05/2017, 11:27 PM

## 2017-01-05 NOTE — ED Notes (Signed)
Called code sepsis to [facility]  

## 2017-01-05 NOTE — ED Notes (Addendum)
CCU nurse called me for patient status and is going to call MD to see if patient can go to another unit.

## 2017-01-05 NOTE — ED Notes (Signed)
Called lab to check on status of urine, per cassandra in the lab, urine has not been ran yet because they were behind. Will inform MD

## 2017-01-06 ENCOUNTER — Encounter: Payer: Self-pay | Admitting: Adult Health

## 2017-01-06 DIAGNOSIS — R4182 Altered mental status, unspecified: Secondary | ICD-10-CM

## 2017-01-06 LAB — GASTROINTESTINAL PANEL BY PCR, STOOL (REPLACES STOOL CULTURE)
ADENOVIRUS F40/41: NOT DETECTED
ASTROVIRUS: NOT DETECTED
CAMPYLOBACTER SPECIES: NOT DETECTED
CYCLOSPORA CAYETANENSIS: NOT DETECTED
Cryptosporidium: NOT DETECTED
ENTAMOEBA HISTOLYTICA: NOT DETECTED
ENTEROPATHOGENIC E COLI (EPEC): NOT DETECTED
ENTEROTOXIGENIC E COLI (ETEC): NOT DETECTED
Enteroaggregative E coli (EAEC): NOT DETECTED
Giardia lamblia: NOT DETECTED
NOROVIRUS GI/GII: NOT DETECTED
Plesimonas shigelloides: NOT DETECTED
Rotavirus A: NOT DETECTED
Salmonella species: NOT DETECTED
Sapovirus (I, II, IV, and V): NOT DETECTED
Shiga like toxin producing E coli (STEC): NOT DETECTED
Shigella/Enteroinvasive E coli (EIEC): NOT DETECTED
VIBRIO CHOLERAE: NOT DETECTED
Vibrio species: NOT DETECTED
Yersinia enterocolitica: NOT DETECTED

## 2017-01-06 LAB — PROCALCITONIN: Procalcitonin: 0.6 ng/mL

## 2017-01-06 LAB — C DIFFICILE QUICK SCREEN W PCR REFLEX
C DIFFICILE (CDIFF) TOXIN: NEGATIVE
C Diff antigen: POSITIVE — AB

## 2017-01-06 LAB — URINE CULTURE: Culture: NO GROWTH

## 2017-01-06 LAB — CBC
HCT: 40.2 % (ref 40.0–52.0)
Hemoglobin: 13.1 g/dL (ref 13.0–18.0)
MCH: 29.8 pg (ref 26.0–34.0)
MCHC: 32.6 g/dL (ref 32.0–36.0)
MCV: 91.5 fL (ref 80.0–100.0)
Platelets: 131 10*3/uL — ABNORMAL LOW (ref 150–440)
RBC: 4.39 MIL/uL — ABNORMAL LOW (ref 4.40–5.90)
RDW: 14.5 % (ref 11.5–14.5)
WBC: 14.8 10*3/uL — ABNORMAL HIGH (ref 3.8–10.6)

## 2017-01-06 LAB — LACTIC ACID, PLASMA
LACTIC ACID, VENOUS: 2.6 mmol/L — AB (ref 0.5–1.9)
LACTIC ACID, VENOUS: 2.1 mmol/L — AB (ref 0.5–1.9)

## 2017-01-06 LAB — BASIC METABOLIC PANEL
Anion gap: 6 (ref 5–15)
Anion gap: 6 (ref 5–15)
BUN: 22 mg/dL — AB (ref 6–20)
BUN: 17 mg/dL (ref 6–20)
CALCIUM: 7 mg/dL — AB (ref 8.9–10.3)
CALCIUM: 7.9 mg/dL — AB (ref 8.9–10.3)
CO2: 25 mmol/L (ref 22–32)
CO2: 23 mmol/L (ref 22–32)
CREATININE: 0.51 mg/dL — AB (ref 0.61–1.24)
Chloride: 123 mmol/L — ABNORMAL HIGH (ref 101–111)
Chloride: 120 mmol/L — ABNORMAL HIGH (ref 101–111)
Creatinine, Ser: 0.65 mg/dL (ref 0.61–1.24)
GFR calc Af Amer: >60 mL/min (ref >60)
GFR calc Af Amer: >60 mL/min (ref >60)
GLUCOSE: 111 mg/dL — AB (ref 65–99)
Glucose, Bld: 141 mg/dL — ABNORMAL HIGH (ref 65–99)
Potassium: 3.6 mmol/L (ref 3.5–5.1)
Potassium: 3 mmol/L — ABNORMAL LOW (ref 3.5–5.1)
SODIUM: 149 mmol/L — AB (ref 135–145)
Sodium: 154 mmol/L — ABNORMAL HIGH (ref 135–145)

## 2017-01-06 LAB — TROPONIN I
TROPONIN I: 0.13 ng/mL — AB (ref <0.03)
TROPONIN I: 0.05 ng/mL — AB (ref <0.03)
Troponin I: 1.1 ng/mL (ref <0.03)

## 2017-01-06 LAB — CLOSTRIDIUM DIFFICILE BY PCR: Toxigenic C. Difficile by PCR: POSITIVE — AB

## 2017-01-06 LAB — MAGNESIUM: Magnesium: 1.9 mg/dL (ref 1.7–2.4)

## 2017-01-06 LAB — PHOSPHORUS: Phosphorus: 1.3 mg/dL — ABNORMAL LOW (ref 2.5–4.6)

## 2017-01-06 LAB — MRSA PCR SCREENING: MRSA BY PCR: NEGATIVE

## 2017-01-06 MED ORDER — VITAMIN D 1000 UNITS PO TABS
5000.0000 [IU] | ORAL_TABLET | Freq: Every day | ORAL | Status: DC
  Administered 2017-01-06 – 2017-01-10 (×4): 5000 [IU] via ORAL
  Filled 2017-01-06 (×5): qty 5

## 2017-01-06 MED ORDER — CHLORHEXIDINE GLUCONATE 0.12 % MT SOLN
15.0000 mL | Freq: Two times a day (BID) | OROMUCOSAL | Status: DC
  Administered 2017-01-06 – 2017-01-10 (×6): 15 mL via OROMUCOSAL
  Filled 2017-01-06 (×6): qty 15

## 2017-01-06 MED ORDER — FREE WATER
150.0000 mL | Freq: Three times a day (TID) | Status: DC
  Administered 2017-01-06 – 2017-01-07 (×3): 150 mL

## 2017-01-06 MED ORDER — SODIUM CHLORIDE 0.9 % IV BOLUS (SEPSIS)
1000.0000 mL | Freq: Once | INTRAVENOUS | Status: AC
  Administered 2017-01-06: 1000 mL via INTRAVENOUS

## 2017-01-06 MED ORDER — METOPROLOL TARTRATE 25 MG PO TABS
12.5000 mg | ORAL_TABLET | Freq: Once | ORAL | Status: AC
  Administered 2017-01-06: 12.5 mg via ORAL

## 2017-01-06 MED ORDER — ORAL CARE MOUTH RINSE
15.0000 mL | Freq: Two times a day (BID) | OROMUCOSAL | Status: DC
  Administered 2017-01-07 – 2017-01-10 (×4): 15 mL via OROMUCOSAL

## 2017-01-06 MED ORDER — VANCOMYCIN 50 MG/ML ORAL SOLUTION
125.0000 mg | Freq: Four times a day (QID) | ORAL | Status: DC
  Administered 2017-01-06 – 2017-01-08 (×10): 125 mg
  Filled 2017-01-06 (×13): qty 2.5

## 2017-01-06 MED ORDER — METOPROLOL TARTRATE 5 MG/5ML IV SOLN
5.0000 mg | Freq: Once | INTRAVENOUS | Status: AC
  Administered 2017-01-06: 5 mg via INTRAVENOUS
  Filled 2017-01-06: qty 5

## 2017-01-06 MED ORDER — METOPROLOL TARTRATE 25 MG PO TABS
25.0000 mg | ORAL_TABLET | Freq: Two times a day (BID) | ORAL | Status: DC
  Administered 2017-01-06: 25 mg via ORAL

## 2017-01-06 NOTE — Progress Notes (Signed)
SLP Cancellation Note  Patient Details Name: Justin [name] MRN: [id number] DOB: [birth year]   Cancelled treatment:       Reason Eval/Treat Not Completed: Other (comment) Reviewed chart and spoke w/nsg. Pt is s/p PEG placement since 2016. Nsg reported that she does not believe that pt has been eating a PO diet and pt is unable to report. Nsg reported that she spoke with MD and will discontinue order.    Kennard,Stephanne Greeley 01/06/2017, 11:36 AM

## 2017-01-06 NOTE — Progress Notes (Signed)
PULMONARY / CRITICAL CARE MEDICINE   Name: Justin [name] MRN: [id number] DOB: [birth year]    ADMISSION DATE:  01/05/2017   CONSULTATION DATE:  01/05/17  REFERRING MD:  Dr. Juliene Pina  REASON: Sepsis and pneumonia  CHIEF COMPLAINT:  AMS and fever  HISTORY OF PRESENT ILLNESS:   This is a 66 y/o AA male, SNF resident who presented to the ED with c/o AMS and fever. Patient is non-verbal hence history is obtained from ED records. Patient was sent to the ED for declining mentation and fever x 1 day. Upon arrival in the ED, patient was tachycardic with a HR 152, febrile with a temp of 103.8 degrees F and tachypneic at 41 breaths/min. He was not hypoxic. ED worked shoed lactic acidosis, RLL pneumonia and a WBC of 16K. Patient was admitted to the SDU for further management. Upon arrival in the ICU, patient had 3 loose stools; no blood. He is awake and will occasionally nod to questions. He remains on RA and his blood pressure is normal.   SUBJECTIVE: Multiple loose stools overnight with mild hypotension that was responsive to fluid boluses. Tylenol given for fever. C-Diff positive. Started on flagyl and oral vanco. Mental status improved.   VITAL SIGNS: BP 103/77   Pulse 95   Temp 98.4 F (36.9 C)   Resp (!) 26   Ht  (1.676 m)   Wt 143 lb 4.8 oz (65 kg)   SpO2 96%   BMI 23.13 kg/m   HEMODYNAMICS:    VENTILATOR SETTINGS:    INTAKE / OUTPUT: I/O last 3 completed shifts: In: 4387 [I.V.:2020.3; IV Piggyback:2366.7] Out: 1415 [Urine:1415]  PHYSICAL EXAMINATION: General: chronically ill looking Neuro:  Awake, aphasic, right hemiparesis HEENT:  PERRLA, oral mucosa dry, tongue with white patches, lips dry  Cardiovascular:  Tachycardic, irregular, S1/S2, no MRG, +2 pulses, no edema Lungs:  Normal WOB, bilateral breath sounds, diminished in the bases, rt>>left, no wheezing Abdomen:  Non-distended, G-Tube in place with mild purulent drainage around insertion site Musculoskeletal:   Contractures in upper and lower extremities, no swelling Skin: febrile to touch, dry, no lesions, feet dry   LABS:  BMET  Recent Labs Lab 01/05/17 1644 01/06/17 0347  NA 148* 154*  K 3.5 3.6  CL 118* 123*  CO2 21* 25  BUN 32* 22*  CREATININE 0.85 0.65  GLUCOSE 256* 111*    Electrolytes  Recent Labs Lab 01/05/17 1644 01/05/17 2208 01/06/17 0347  CALCIUM 7.5*  --  7.0*  MG  --  2.0  --   PHOS  --  2.6  --     CBC  Recent Labs Lab 01/05/17 1550 01/06/17 0347  WBC 16.4* 14.8*  HGB 16.7 13.1  HCT 50.1 40.2  PLT 226 131*    Coag's No results for input(s): APTT, INR in the last 168 hours.  Sepsis Markers  Recent Labs Lab 01/05/17 1846 01/05/17 2208 01/06/17 0347  LATICACIDVEN 4.4* 2.9* 2.6*  PROCALCITON  --  0.69 0.60    ABG No results for input(s): PHART, PCO2ART, PO2ART in the last 168 hours.  Liver Enzymes  Recent Labs Lab 01/05/17 1644  AST 68*  ALT 82*  ALKPHOS 55  BILITOT 0.8  ALBUMIN 2.5*    Cardiac Enzymes  Recent Labs Lab 01/05/17 1644 01/05/17 2208 01/06/17 0347  TROPONINI 0.16* 0.20* 0.13*    Glucose  Recent Labs Lab 01/05/17 2115  GLUCAP 100*    Imaging Ct Head Wo Contrast  Result Date: 01/05/2017 CLINICAL DATA:  Worsening of mental status. Fever. Tachycardia. Rapid breathing. EXAM: CT HEAD WITHOUT CONTRAST TECHNIQUE: Contiguous axial images were obtained from the base of the skull through the vertex without intravenous contrast. COMPARISON:  09/25/2015 FINDINGS: Brain: Generalized brain atrophy. Extensive old ischemic changes. Old right cerebellar infarction. Old right frontal and insular infarction. Old right frontoparietal vertex infarction. Old left frontal and insular infarction. Old left temporal infarction. Old left occipital infarction. Old left parietal and posterior frontal infarction. No evidence of hemorrhage, hydrocephalus or extra-axial collection. Vascular: No abnormal vascular finding. Skull: Negative  Sinuses/Orbits: Sinuses are clear. Old right medial orbital blowout fracture. No acute orbital finding. Other: None IMPRESSION: No acute finding by CT extensive old brain infarctions scattered throughout as outlined above. Electronically Signed   By: Paulina Fusi M.D.   On: 01/05/2017 18:45   Ct Chest W Contrast  Result Date: 01/05/2017 CLINICAL DATA:  Worsening mental status.  Fever.  Rapid breathing. EXAM: CT CHEST WITH CONTRAST TECHNIQUE: Multidetector CT imaging of the chest was performed during intravenous contrast administration. CONTRAST:  ISOVUE-300 IOPAMIDOL (ISOVUE-300) INJECTION 61% COMPARISON:  Chest radiography same day FINDINGS: Cardiovascular: Aortic atherosclerosis. No aneurysm or dissection. No visible pulmonary emboli. The heart is not enlarged. No coronary artery calcification is seen. Mediastinum/Nodes: Prominence of the right hilar and paratracheal nodes, felt most likely reactive. See below discussion. Lungs/Pleura: Left lung is clear. There is right lower lobe collapse and pneumonia. Right hilar and paratracheal nodes are fell reactive to the right lower lobe pneumonia. Cannot rule out the possibility of underlying tumor with metastasis. Consider repeat examination after treatment if appropriate. Upper Abdomen: See results of abdominal study. Musculoskeletal: Negative IMPRESSION: Right lower lobe pneumonia and collapse. Enlargement of the right hilar and paratracheal nodes, most likely reactive to the right lower lobe pneumonia. I would suggest follow-up after treatment, if clinically appropriate, as underlying malignancy can't be excluded. Electronically Signed   By: Paulina Fusi M.D.   On: 01/05/2017 18:49   Ct Abdomen Pelvis W Contrast  Result Date: 01/05/2017 CLINICAL DATA:  Fever.  Rapid breathing.  Worsening mental status. EXAM: CT ABDOMEN AND PELVIS WITH CONTRAST TECHNIQUE: Multidetector CT imaging of the abdomen and pelvis was performed using the standard protocol  following bolus administration of intravenous contrast. CONTRAST:  ISOVUE-300 IOPAMIDOL (ISOVUE-300) INJECTION 61% COMPARISON:  09/26/2015 FINDINGS: Lower chest: See results of chest CT.  Right lower lobe pneumonia. Hepatobiliary: Negative Pancreas: Normal Spleen: Normal Adrenals/Urinary Tract: Adrenal glands are normal. Kidneys are normal. No bladder abnormality seen. Catheter within the bladder. Stomach/Bowel: Gastrostomy tube in place. No evidence of ileus or obstruction. No focal bowel lesion. Vascular/Lymphatic: Aortic atherosclerosis. No aneurysm. IVC is normal. No retroperitoneal adenopathy. Reproductive: Normal Other: No free fluid or air. Musculoskeletal: Negative.  Ordinary lumbar degenerative changes. IMPRESSION: No acute abdominal finding. Gastrostomy in place. Foley catheter in place. Electronically Signed   By: Paulina Fusi M.D.   On: 01/05/2017 18:52   Dg Chest Portable 1 View  Result Date: 01/05/2017 CLINICAL DATA:  Altered mental status and fever. EXAM: PORTABLE CHEST 1 VIEW COMPARISON:  09/25/2015 FINDINGS: Mild cardiomegaly. Chronic aortic atherosclerosis. No evidence heart failure or effusion. The left lung is clear. Question medial right lower lobe pneumonia, based on asymmetric retrocardiac density. Consider two-view radiography. IMPRESSION: Suspicion of medial right lower lobe pneumonia. Cardiomegaly and aortic atherosclerosis. Electronically Signed   By: Paulina Fusi M.D.   On: 01/05/2017 17:57    STUDIES:  None  CULTURES: Blood x 2>> Urine>> Stool  for c-diff>POSITIVE GI panel-Negative MRSA screen>negative  ANTIBIOTICS: Vancomycin> Zosyn> Flagyl> Oral vancomycin> Ceftriaxone given X 1  SIGNIFICANT EVENTS: 9/14>ADMITTED  LINES/TUBES: G-Tube PIVs Foley  DISCUSSION: 66 y/o AA male admitted from SNF with sepsis, HCAP and diarrhea  ASSESSMENT  Sepsis 2/2 PNA RLL pneumonia C-diff colitis Hypernatremia Diarrhea Hypovolemic/septic shock-resolved Acute  metabolic encephalopathy Afib with RVR Oral thrush History of: CVA, Seizure, hypertension and hyperlipidemia  PLAN Hemodynamics per ICU protocol IV Abx as above F/U cultures Continue IV fluids Stool for GI panel and c-diff Trend Procalcitonin Trend troponin Continue home dose of statin, xarelto, metoprolol, keppra, oral nystatin, and baclofen Prn morphine for pain Hold tube feeds until diarrhea resolves  FAMILY  - Updates: no family at bedside. Will update when available.  - Inter-disciplinary family meet or Palliative Care meeting due by:  day 7  [facility] ANP-BC Pulmonary and Critical Care Medicine [facility] Pager [phone] or [phone]  01/06/2017, 7:42 AM

## 2017-01-06 NOTE — Clinical Social Work Note (Signed)
CSW received consult for nursing home placement. The patient is a resident of [facility]. CSW will assess when able.  Argentina Ponder, MSW, Theresia Majors [phone]

## 2017-01-06 NOTE — Progress Notes (Signed)
Patient is nonverbal. + C. Diff. - MRSA.  Precautions in place. PEG patent.  All meds given. Multiple loose stools this shift.  Fluids given this shift 3 liter bolus's).  Tolerated well.  Good urine output 890.

## 2017-01-06 NOTE — Progress Notes (Addendum)
[facility] [place] at [facility]   PATIENT NAME: Justin [name]    MR#:  [id number]  DATE OF BIRTH:  1951-01-14  SUBJECTIVE: Admitted for fever, weakness. Found to have patient has history of previous CVA, nonverbal. Nods head to questions.. Slightly tachycardic. She has some loose stools.  CHIEF COMPLAINT:   Chief Complaint  Patient presents with  . Code Sepsis    REVIEW OF SYSTEMS:   Review of Systems  Unable to perform ROS: Patient nonverbal     DRUG ALLERGIES:  No Known Allergies  VITALS:  Blood pressure 115/77, pulse (!) 111, temperature 97.9 F (36.6 C), temperature source Core (Comment), resp. rate (!) 23, height  (1.676 m), weight 65 kg (143 lb 4.8 oz), SpO2 97 %.  PHYSICAL EXAMINATION:  GENERAL:  65 y.o.-year-old patient lying in the bed with no acute distress.  EYES: Pupils equal, round, reactive to light . No scleral icterus.   HEENT: Head atraumatic, normocephalic. Oropharynx and nasopharynx clear.  NECK:  Supple, no jugular venous distention. No thyroid enlargement, no tenderness.  LUNGS: Normal breath sounds bilaterally, no wheezing, rales,rhonchi or crepitation. No use of accessory muscles of respiration.  CARDIOVASCULAR: S1, S2 normal. No murmurs, rubs, or gallops.  ABDOMEN: Soft, nontender, nondistended. Bowel sounds present.PEG tube in place EXTREMITIES: No pedal edema, cyanosis, or clubbing.  NEUROLOGIC: Unable to do full neurological exam because of normal state and not following commands PSYCHIATRIC: The patient is alert.  but nonverbal. SKIN: No obvious rash, lesion, or ulcer.    LABORATORY PANEL:   CBC  Recent Labs Lab 01/06/17 0347  WBC 14.8*  HGB 13.1  HCT 40.2  PLT 131*   ------------------------------------------------------------------------------------------------------------------  Chemistries   Recent Labs Lab 01/05/17 1644 01/05/17 2208 01/06/17 0347  NA 148*  --  154*  K 3.5  --  3.6  CL  118*  --  123*  CO2 21*  --  25  GLUCOSE 256*  --  111*  BUN 32*  --  22*  CREATININE 0.85  --  0.65  CALCIUM 7.5*  --  7.0*  MG  --  2.0  --   AST 68*  --   --   ALT 82*  --   --   ALKPHOS 55  --   --   BILITOT 0.8  --   --    ------------------------------------------------------------------------------------------------------------------  Cardiac Enzymes  Recent Labs Lab 01/06/17 1031  TROPONINI 1.10*   ------------------------------------------------------------------------------------------------------------------  RADIOLOGY:  Ct Head Wo Contrast  Result Date: 01/05/2017 CLINICAL DATA:  Worsening of mental status. Fever. Tachycardia. Rapid breathing. EXAM: CT HEAD WITHOUT CONTRAST TECHNIQUE: Contiguous axial images were obtained from the base of the skull through the vertex without intravenous contrast. COMPARISON:  09/25/2015 FINDINGS: Brain: Generalized brain atrophy. Extensive old ischemic changes. Old right cerebellar infarction. Old right frontal and insular infarction. Old right frontoparietal vertex infarction. Old left frontal and insular infarction. Old left temporal infarction. Old left occipital infarction. Old left parietal and posterior frontal infarction. No evidence of hemorrhage, hydrocephalus or extra-axial collection. Vascular: No abnormal vascular finding. Skull: Negative Sinuses/Orbits: Sinuses are clear. Old right medial orbital blowout fracture. No acute orbital finding. Other: None IMPRESSION: No acute finding by CT extensive old brain infarctions scattered throughout as outlined above. Electronically Signed   By: Paulina Fusi M.D.   On: 01/05/2017 18:45   Ct Chest W Contrast  Result Date: 01/05/2017 CLINICAL DATA:  Worsening mental status.  Fever.  Rapid breathing. EXAM:  CT CHEST WITH CONTRAST TECHNIQUE: Multidetector CT imaging of the chest was performed during intravenous contrast administration. CONTRAST:  ISOVUE-300 IOPAMIDOL (ISOVUE-300) INJECTION  61% COMPARISON:  Chest radiography same day FINDINGS: Cardiovascular: Aortic atherosclerosis. No aneurysm or dissection. No visible pulmonary emboli. The heart is not enlarged. No coronary artery calcification is seen. Mediastinum/Nodes: Prominence of the right hilar and paratracheal nodes, felt most likely reactive. See below discussion. Lungs/Pleura: Left lung is clear. There is right lower lobe collapse and pneumonia. Right hilar and paratracheal nodes are fell reactive to the right lower lobe pneumonia. Cannot rule out the possibility of underlying tumor with metastasis. Consider repeat examination after treatment if appropriate. Upper Abdomen: See results of abdominal study. Musculoskeletal: Negative IMPRESSION: Right lower lobe pneumonia and collapse. Enlargement of the right hilar and paratracheal nodes, most likely reactive to the right lower lobe pneumonia. I would suggest follow-up after treatment, if clinically appropriate, as underlying malignancy can't be excluded. Electronically Signed   By: Paulina Fusi M.D.   On: 01/05/2017 18:49   Ct Abdomen Pelvis W Contrast  Result Date: 01/05/2017 CLINICAL DATA:  Fever.  Rapid breathing.  Worsening mental status. EXAM: CT ABDOMEN AND PELVIS WITH CONTRAST TECHNIQUE: Multidetector CT imaging of the abdomen and pelvis was performed using the standard protocol following bolus administration of intravenous contrast. CONTRAST:  ISOVUE-300 IOPAMIDOL (ISOVUE-300) INJECTION 61% COMPARISON:  09/26/2015 FINDINGS: Lower chest: See results of chest CT.  Right lower lobe pneumonia. Hepatobiliary: Negative Pancreas: Normal Spleen: Normal Adrenals/Urinary Tract: Adrenal glands are normal. Kidneys are normal. No bladder abnormality seen. Catheter within the bladder. Stomach/Bowel: Gastrostomy tube in place. No evidence of ileus or obstruction. No focal bowel lesion. Vascular/Lymphatic: Aortic atherosclerosis. No aneurysm. IVC is normal. No retroperitoneal adenopathy.  Reproductive: Normal Other: No free fluid or air. Musculoskeletal: Negative.  Ordinary lumbar degenerative changes. IMPRESSION: No acute abdominal finding. Gastrostomy in place. Foley catheter in place. Electronically Signed   By: Paulina Fusi M.D.   On: 01/05/2017 18:52   Dg Chest Portable 1 View  Result Date: 01/05/2017 CLINICAL DATA:  Altered mental status and fever. EXAM: PORTABLE CHEST 1 VIEW COMPARISON:  09/25/2015 FINDINGS: Mild cardiomegaly. Chronic aortic atherosclerosis. No evidence heart failure or effusion. The left lung is clear. Question medial right lower lobe pneumonia, based on asymmetric retrocardiac density. Consider two-view radiography. IMPRESSION: Suspicion of medial right lower lobe pneumonia. Cardiomegaly and aortic atherosclerosis. Electronically Signed   By: Paulina Fusi M.D.   On: 01/05/2017 17:57    EKG:   Orders placed or performed during the hospital encounter of 01/05/17  . ED EKG  . ED EKG  . EKG 12-Lead  . EKG 12-Lead  . EKG 12-Lead  . EKG 12-Lead    ASSESSMENT AND PLAN:  #69.66 year old male patient with severe sepsis with high fevers, elevated lactic acid secondary to right lower lobe pneumonia, C. difficile colitis: Continue IV Rocephin, IV Flagyl.,, By mouth vancomycin through the PEG tube. IV fluids.  #2. History of previous CVA: Nonverbal. Continue tube feedings, patient has COPD and sent free water flushes: Continue them. #3 atrial fibrillation with RVR: Continue Cardizem drip, continue Xarelto. #4 hypernatremia due to dehydration: Increase free water flushes via feeding tube.Marland Kitchen CODE STATUS DO NOT RESUSCITATE. #5 elevated troponin secondary to demand ischemia. Continue metoprolol, statins. #6. history of seizures: Continue Keppra. Prognosis is poor, All the records are reviewed and case discussed with Care Management/Social Workerr. Management plans discussed with the patient, family and they are in agreement.  CODE  STATUS: DO NOT  RESUSCITATE  TOTAL TIME TAKING CARE OF THIS PATIENT:35 minutes.   POSSIBLE D/C IN 2-3 DAYS, DEPENDING ON CLINICAL CONDITION.   Katha Hamming M.D on 01/06/2017 at 12:39 PM  Between 7am to 6pm - Pager - [phone]  After 6pm go to [email or web address] - password [email or web address] [facility]  [place] [facility]  Office  [phone]  CC: Primary care physician; Dimple Casey, MD   Note: This dictation was prepared with Dragon dictation along with smaller phrase technology. Any transcriptional errors that result from this process are unintentional.

## 2017-01-06 NOTE — Progress Notes (Signed)
eLink Physician-Brief Progress Note Patient Name: Justin [name] DOB: 1951/03/06 MRN: [id number]   Date of Service  01/06/2017  HPI/Events of Note  47 M with PMH of CVA and chronic AF presenting with sepsis syndrome with findins of RML PNA on PCXR and CT.  On broad spec ABX.  Lactate elevated in ED but trending down now to 2.9.  HR elevated in ED to the 150s but now is 86 with BP of 101/68 (78).  RR of 24 with sats of 95%  eICU Interventions  Plan of care per primary admitting team. Continue to monitor via [facility] Patient is a DNR     Intervention Category Evaluation Type: New Patient Evaluation  DETERDING,ELIZABETH 01/06/2017, 12:13 AM

## 2017-01-07 ENCOUNTER — Inpatient Hospital Stay
Admit: 2017-01-07 | Discharge: 2017-01-07 | Disposition: A | Discharge status: Home / Self Care | Payer: Medicare Other | Attending: Internal Medicine | Admitting: Internal Medicine

## 2017-01-07 LAB — BLOOD CULTURE ID PANEL (REFLEXED)
ACINETOBACTER BAUMANNII: NOT DETECTED
CANDIDA ALBICANS: NOT DETECTED
Candida glabrata: NOT DETECTED
Candida krusei: NOT DETECTED
Candida parapsilosis: NOT DETECTED
Candida tropicalis: NOT DETECTED
ENTEROBACTERIACEAE SPECIES: NOT DETECTED
ENTEROCOCCUS SPECIES: NOT DETECTED
Enterobacter cloacae complex: NOT DETECTED
Escherichia coli: NOT DETECTED
HAEMOPHILUS INFLUENZAE: NOT DETECTED
Klebsiella oxytoca: NOT DETECTED
Klebsiella pneumoniae: NOT DETECTED
Listeria monocytogenes: NOT DETECTED
METHICILLIN RESISTANCE: NOT DETECTED
NEISSERIA MENINGITIDIS: NOT DETECTED
PSEUDOMONAS AERUGINOSA: NOT DETECTED
Proteus species: NOT DETECTED
SERRATIA MARCESCENS: NOT DETECTED
STAPHYLOCOCCUS AUREUS BCID: NOT DETECTED
STREPTOCOCCUS PNEUMONIAE: NOT DETECTED
STREPTOCOCCUS PYOGENES: NOT DETECTED
STREPTOCOCCUS SPECIES: NOT DETECTED
Staphylococcus species: DETECTED — AB
Streptococcus agalactiae: NOT DETECTED

## 2017-01-07 LAB — ECHOCARDIOGRAM COMPLETE
FS: 38 % (ref 28–44)
HEIGHTINCHES: 66 in
IVS/LV PW RATIO, ED: 1.12
LA ID, A-P, ES: 50 mm
LA vol A4C: 86.3 ml
LA vol index: 63.6 mL/m2
LA vol: 111 mL
LADIAMINDEX: 2.87 cm/m2
LEFT ATRIUM END SYS DIAM: 50 mm
LV PW d: 11.7 mm — AB (ref 0.6–1.1)
RV LATERAL S' VELOCITY: 17.5 cm/s
RV TAPSE: 27.7 mm
Weight: 2292.78 oz

## 2017-01-07 LAB — BLOOD GAS, VENOUS
Acid-base deficit: 0.3 mmol/L (ref 0.0–2.0)
Bicarbonate: 24.8 mmol/L (ref 20.0–28.0)
FIO2: 0.21
PH VEN: 7.39 (ref 7.250–7.430)
Patient temperature: 37
pCO2, Ven: 41 mmHg — ABNORMAL LOW (ref 44.0–60.0)
pO2, Ven: <31 mmHg — CL (ref 32.0–45.0)

## 2017-01-07 LAB — BASIC METABOLIC PANEL
Anion gap: 4 — ABNORMAL LOW (ref 5–15)
BUN: 17 mg/dL (ref 6–20)
CALCIUM: 8 mg/dL — AB (ref 8.9–10.3)
CO2: 25 mmol/L (ref 22–32)
CREATININE: 0.53 mg/dL — AB (ref 0.61–1.24)
Chloride: 121 mmol/L — ABNORMAL HIGH (ref 101–111)
GFR calc Af Amer: >60 mL/min (ref >60)
GLUCOSE: 145 mg/dL — AB (ref 65–99)
POTASSIUM: 3.2 mmol/L — AB (ref 3.5–5.1)
SODIUM: 150 mmol/L — AB (ref 135–145)

## 2017-01-07 LAB — CBC
HEMATOCRIT: 38.7 % — AB (ref 40.0–52.0)
Hemoglobin: 12.8 g/dL — ABNORMAL LOW (ref 13.0–18.0)
MCH: 29.7 pg (ref 26.0–34.0)
MCHC: 33 g/dL (ref 32.0–36.0)
MCV: 90 fL (ref 80.0–100.0)
Platelets: 126 10*3/uL — ABNORMAL LOW (ref 150–440)
RBC: 4.3 MIL/uL — ABNORMAL LOW (ref 4.40–5.90)
RDW: 14.4 % (ref 11.5–14.5)
WBC: 9.8 10*3/uL (ref 3.8–10.6)

## 2017-01-07 LAB — PHOSPHORUS: Phosphorus: 1.7 mg/dL — ABNORMAL LOW (ref 2.5–4.6)

## 2017-01-07 LAB — MAGNESIUM: MAGNESIUM: 2 mg/dL (ref 1.7–2.4)

## 2017-01-07 LAB — LACTIC ACID, PLASMA: Lactic Acid, Venous: 2.5 mmol/L (ref 0.5–1.9)

## 2017-01-07 LAB — PROCALCITONIN: PROCALCITONIN: 0.33 ng/mL

## 2017-01-07 MED ORDER — ADULT MULTIVITAMIN LIQUID CH
15.0000 mL | Freq: Every day | ORAL | Status: DC
  Administered 2017-01-08 – 2017-01-10 (×3): 15 mL
  Filled 2017-01-07 (×4): qty 15

## 2017-01-07 MED ORDER — POTASSIUM PHOSPHATES 15 MMOLE/5ML IV SOLN
30.0000 mmol | Freq: Once | INTRAVENOUS | Status: DC
  Filled 2017-01-07: qty 10

## 2017-01-07 MED ORDER — METOPROLOL TARTRATE 50 MG PO TABS
50.0000 mg | ORAL_TABLET | Freq: Two times a day (BID) | ORAL | Status: DC
  Administered 2017-01-07 – 2017-01-10 (×6): 50 mg via ORAL
  Filled 2017-01-07 (×7): qty 1

## 2017-01-07 MED ORDER — OSMOLITE 1.2 CAL PO LIQD
1000.0000 mL | ORAL | Status: AC
  Administered 2017-01-07 – 2017-01-08 (×2): 1000 mL

## 2017-01-07 MED ORDER — DEXTROSE IN LACTATED RINGERS 5 % IV SOLN
INTRAVENOUS | Status: DC
  Administered 2017-01-07 – 2017-01-08 (×2): via INTRAVENOUS

## 2017-01-07 MED ORDER — POTASSIUM PHOSPHATE MONOBASIC 500 MG PO TABS
1000.0000 mg | ORAL_TABLET | Freq: Three times a day (TID) | ORAL | Status: DC
  Administered 2017-01-07: 1000 mg
  Filled 2017-01-07 (×4): qty 2

## 2017-01-07 MED ORDER — K PHOS MONO-SOD PHOS DI & MONO 155-852-130 MG PO TABS
1000.0000 mg | ORAL_TABLET | Freq: Once | ORAL | Status: AC
  Administered 2017-01-07: 1000 mg
  Filled 2017-01-07: qty 4

## 2017-01-07 MED ORDER — POTASSIUM & SODIUM PHOSPHATES 280-160-250 MG PO PACK
2.0000 | PACK | Freq: Three times a day (TID) | ORAL | Status: DC
  Filled 2017-01-07 (×2): qty 2

## 2017-01-07 MED ORDER — POTASSIUM & SODIUM PHOSPHATES 280-160-250 MG PO PACK
2.0000 | PACK | Freq: Three times a day (TID) | ORAL | Status: AC
  Administered 2017-01-07 – 2017-01-09 (×7): 2
  Filled 2017-01-07 (×9): qty 2

## 2017-01-07 MED ORDER — POTASSIUM CHLORIDE 20 MEQ PO PACK
60.0000 meq | PACK | Freq: Once | ORAL | Status: AC
  Administered 2017-01-07: 60 meq
  Filled 2017-01-07: qty 3

## 2017-01-07 MED ORDER — DEXTROSE 5 % IN LACTATED RINGERS IV BOLUS
1000.0000 mL | Freq: Once | INTRAVENOUS | Status: AC
  Administered 2017-01-07: 1000 mL via INTRAVENOUS

## 2017-01-07 MED ORDER — FREE WATER
200.0000 mL | Freq: Four times a day (QID) | Status: DC
  Administered 2017-01-07 – 2017-01-10 (×11): 200 mL

## 2017-01-07 NOTE — Progress Notes (Signed)
Pt having bedside echocardiogram at this time, hold meds for now

## 2017-01-07 NOTE — Progress Notes (Signed)
Initial Nutrition Assessment  DOCUMENTATION CODES:   Not applicable  INTERVENTION:  Recommend new goal TF regimen of Osmolite 1.2 at 70 ml/hr via G-tube. Continue free water flush of 150 ml Q8hrs at this time. Provides 2016 kcal, 93 grams of protein, 1828 ml H2O (between H2O in tube feeding and FWF), 0 grams of fiber.  Request has been made to get TwoCal HN from [facility]. If unable to obtain from [facility], can also place order tomorrow and it would be in by Tuesday. When able to obtain TwoCal HN, recommend transitioning to patient's usual TF regimen of TwoCal HN at 100 ml/hr over 10 hours (0800 to 1800) via G-tube with free water flush of 200 ml Q4hrs. Provides 2000 kcal, 83.5 grams of protein, 1900 ml H2O (between TF and FWF), 5 grams of fiber.  While patient is in hospital, recommend changing daily MVI PO to liquid MVI daily per tube.  NUTRITION DIAGNOSIS:   Inadequate oral intake related to dysphagia as evidenced by other (see comment) (reliance on TF via G-tube to meet calorie/protein needs, baseline diet of dysphagia 1(puree) with nectar-thick liquids).  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 with fever and generalized weakness found to have sepsis secondary to right lower lobe PNA, C. Difficile colitis.   Met with patient at bedside. No family members present. He is nonverbal at baseline. Patient is alert and able to respond to some questions by nodding head yes or no. Noted on exam patient's abdomen was distended. He nodded yes when asked if it feels distended or if  it feels like he is having gas. He denied any nausea or abdominal pain. Discussed with patient that we do not currently have his usual tube feeding in stock, but we will meet his calorie and protein needs until able to get a box of his tube feeds.  Per review of his paper chart from [facility], patient is usually on a level 4 diet (pureed) with mildly-thick liquids (nectar-thick liquids). Unsure how much he eats at meals as his goal TF regimen appears to be designed to meet 100% calorie/protein needs. He is on tube feeds via G-tube of TwoCal HN at 100 ml/hr over 10 hours (0800-1800) with free water flush of 200 ml Q4hrs. Total regimen provides 2000 kcal, 83.5 grams of protein, 1900 ml H2O (between TF and FWF), 5 grams of fiber. He is also on daily MVI tablet crushed per tube daily.  Discussed with pharmacy. Request has been put in to get a case of TwoCal HN from [facility] as it is carried there. If unable to get a case from [facility] can place an order tomorrow and have it in by Tuesday.  Per review of weights in chart patient appears weight stable. He was 149.9 lbs on 03/15/2015 and 147.1 lbs on 10/02/2015.  Access: G-tube present on admission (was initially placed 03/27/2015 at [facility])  TF: patient currently tolerating Jevity 1.2 at 75 ml/hr. Over 24 hrs this regimen provides 2160 kcal, 100 grams of protein, 1458 ml H2O, 32 grams of fiber. Ordered for  free water flush of 150 ml Q8hrs (provides total of 1908 ml H2O including water in tube feeding).  Medications reviewed and include: baclofen 10 mg TID, vitamin D 5000 units daily, Keppra, MVI daily, nystatin, pantoprazole, K-Phos Original 1000 mg TID with meals and QHS, vancomycin, D5LR @ 150 ml/hr (3600 ml, 180 grams dextrose, 612 kcal daily), Flagyl, Zosyn.  Labs reviewed: Sodium 150, Potassium 3.2, Chloride 121, Creatinine 0.53, Anion gap 4, Phosphorus 1.7 (trending up from 1.3 on 9/15), Lactic Acid 2.5.  Nutrition-Focused physical exam  completed. Findings are mild fat depletion (orbital region, upper arm region, thoracic/lumbar region), moderate muscle depletion (moderate depletion of patellar region, anterior thigh region, posterior calf region), and no edema. Abdomen distended on exam. Unsure if distention was present on admission. Concerned it may be partially related to high amount of fiber in Jevity 1.2 compared to patient's usual TF regimen.  Patient does not meet criteria for malnutrition at this time. He has been receiving adequate nutrition per G-tube and has remained weight stable. Mild wasting of fat on exam, but muscle wasting in lower extremities is expected as patient is bed-bound.  Discussed with RN.  Discussed nutrition plan of care with MD. Faythe Ghee to change TF formula to Osmolite 1.2.  Diet Order:  Diet NPO time specified  Skin:  Reviewed, no issues  Last BM:  01/07/2017 - medium type 6  Height:   Ht Readings from Last 1 Encounters:  01/05/17 5' 6"  (1.676 m)    Weight:   Wt Readings from Last 1 Encounters:  01/05/17 143 lb 4.8 oz (65 kg)    Ideal Body Weight:  64.5 kg  BMI:  Body mass index is 23.13 kg/m.  Estimated Nutritional Needs:   Kcal:  1780-2050 (MSJ x 1.3-1.5)  Protein:  80-90 grams (1.2-1.4 grams/kg)  Fluid:  1.9-2.2 L/day (30-35 ml/kg)  EDUCATION NEEDS:   No education needs identified at this time  Willey Blade, MS, RD, LDN Pager: [phone] After Hours Pager: [phone]

## 2017-01-07 NOTE — Progress Notes (Signed)
Patient tolerated all meds.  Afebrile. No loose stools this shift. Potassium 3.0.  Supplement given.  Bolus infusion given. PRN Acetaminophen given for pain relief. Jevity continues at 75 ml/hr.  Will monitor.

## 2017-01-07 NOTE — Progress Notes (Signed)
eLink Physician-Brief Progress Note Patient Name: Justin [name] DOB: 07-14-1950 MRN: [id number]   Date of Service  01/07/2017  HPI/Events of Note  Hypokalemia and hypophos  eICU Interventions  Potassium and phos replaced     Intervention Category Intermediate Interventions: Electrolyte abnormality - evaluation and management  Tiarna Koppen 01/07/2017, 1:35 AM

## 2017-01-07 NOTE — Clinical Social Work Note (Signed)
Clinical Social Work Assessment  Patient Details  Name: Justin [name] MRN: [id number] Date of Birth: 05-Apr-1951  Date of referral:  01/07/17               Reason for consult:  Facility Placement                Permission sought to share information with:  Facility Medical sales representative Permission granted to share information::  Yes, Verbal Permission Granted  Name::        Agency::  [facility]  Relationship::     Contact Information:  [phone]  Housing/Transportation Living arrangements for the past 2 months:  Skilled Nursing Facility Source of Information:  Medical Team, Facility, Other (Comment Required) (Sibling) Patient Interpreter Needed:  None Criminal Activity/Legal Involvement Pertinent to Current Situation/Hospitalization:  No - Comment as needed Significant Relationships:  Merchandiser, retail, Siblings Lives with:  Facility Resident Do you feel safe going back to the place where you live?  Yes Need for family participation in patient care:  Yes (Comment) (Patient is non-verbal)  Care giving concerns:  Patient admitted from facility   Social Worker assessment / plan:  The CSW attempted to visit the family at bedside without success. The CSW left a HIPPA compliant phone message for the patient's sister, Justin [name], who has been the contact at past admissions. The patient is a LTC resident at [facility], and the plan is for him to return when stable. Doug, admissions coordinator, is aware and in agreement.   Employment status:  Retired Database administrator PT Recommendations:  Not assessed at this time Information / Referral to community resources:     Patient/Family's Response to care:  The patient's family was not available. The patient is non-verbal.  Patient/Family's Understanding of and Emotional Response to Diagnosis, Current Treatment, and Prognosis:  The patient and his family have historically been in agreement  with return to [facility].  Emotional Assessment Appearance:  Appears stated age Attitude/Demeanor/Rapport:  Lethargic Affect (typically observed):  Appropriate Orientation:  Oriented to Self Alcohol / Substance use:  Never Used Psych involvement (Current and /or in the community):  No (Comment)  Discharge Needs  Concerns to be addressed:  Care Coordination, Discharge Planning Concerns Readmission within the last 30 days:  No Current discharge risk:  Chronically ill Barriers to Discharge:  Continued Medical Work up   [employer], LCSW 01/07/2017, 10:31 AM

## 2017-01-07 NOTE — Progress Notes (Addendum)
[facility] [place] [facility]   PATIENT NAME: Justin [name]    MR#:  [id number]  DATE OF BIRTH:  [birth year]  SUBJECTIVE: Admitted for fever, weakness. Found to have sepsis. patient has history of previous CVA, nonv erbal. Nods head to questions.. Slightly tachycardic. She has some loose no further loose stools. Being transferred to telemetry.   CHIEF COMPLAINT:   Chief Complaint  Patient presents with  . Code Sepsis    REVIEW OF SYSTEMS:   Review of Systems  Unable to perform ROS: Patient nonverbal     DRUG ALLERGIES:  No Known Allergies  VITALS:  Blood pressure 128/70, pulse (!) 122, temperature 99.8 F (37.7 C), temperature source Oral, resp. rate 18, height  (1.676 m), weight 65 kg (143 lb 4.8 oz), SpO2 95 %.  PHYSICAL EXAMINATION:  GENERAL:  66 y.o.-year-old patient lying in the bed with no acute distress.  EYES: Pupils equal, round, reactive to light . No scleral icterus.   HEENT: Head atraumatic, normocephalic. Oropharynx and nasopharynx clear.  NECK:  Supple, no jugular venous distention. No thyroid enlargement, no tenderness.  LUNGS: Normal breath sounds bilaterally, no wheezing, rales,rhonchi or crepitation. No use of accessory muscles of respiration.  CARDIOVASCULAR: S1, S2 normal. No murmurs, rubs, or gallops.  ABDOMEN: Soft, nontender, nondistended. Bowel sounds present.PEG tube in place EXTREMITIES: No pedal edema, cyanosis, or clubbing.  NEUROLOGIC: Unable to do full neurological exam because of normal state and not following commands PSYCHIATRIC: The patient is alert.  but nonverbal. SKIN: No obvious rash, lesion, or ulcer.    LABORATORY PANEL:   CBC  Recent Labs Lab 01/07/17 0446  WBC 9.8  HGB 12.8*  HCT 38.7*  PLT 126*   ------------------------------------------------------------------------------------------------------------------  Chemistries   Recent Labs Lab 01/05/17 1644  01/07/17 0446  NA 148*  <  > 150*  K 3.5  < > 3.2*  CL 118*  < > 121*  CO2 21*  < > 25  GLUCOSE 256*  < > 145*  BUN 32*  < > 17  CREATININE 0.85  < > 0.53*  CALCIUM 7.5*  < > 8.0*  MG  --   < > 2.0  AST 68*  --   --   ALT 82*  --   --   ALKPHOS 55  --   --   BILITOT 0.8  --   --   < > = values in this interval not displayed. ------------------------------------------------------------------------------------------------------------------  Cardiac Enzymes  Recent Labs Lab 01/06/17 2305  TROPONINI 0.05*   ------------------------------------------------------------------------------------------------------------------  RADIOLOGY:  Ct Head Wo Contrast  Result Date: 01/05/2017 CLINICAL DATA:  Worsening of mental status. Fever. Tachycardia. Rapid breathing. EXAM: CT HEAD WITHOUT CONTRAST TECHNIQUE: Contiguous axial images were obtained from the base of the skull through the vertex without intravenous contrast. COMPARISON:  09/25/2015 FINDINGS: Brain: Generalized brain atrophy. Extensive old ischemic changes. Old right cerebellar infarction. Old right frontal and insular infarction. Old right frontoparietal vertex infarction. Old left frontal and insular infarction. Old left temporal infarction. Old left occipital infarction. Old left parietal and posterior frontal infarction. No evidence of hemorrhage, hydrocephalus or extra-axial collection. Vascular: No abnormal vascular finding. Skull: Negative Sinuses/Orbits: Sinuses are clear. Old right medial orbital blowout fracture. No acute orbital finding. Other: None IMPRESSION: No acute finding by CT extensive old brain infarctions scattered throughout as outlined above. Electronically Signed   By: Paulina Fusi M.D.   On: 01/05/2017 18:45   Ct Chest W Contrast  Result Date: 01/05/2017 CLINICAL DATA:  Worsening mental status.  Fever.  Rapid breathing. EXAM: CT CHEST WITH CONTRAST TECHNIQUE: Multidetector CT imaging of the chest was performed during intravenous contrast  administration. CONTRAST:  ISOVUE-300 IOPAMIDOL (ISOVUE-300) INJECTION 61% COMPARISON:  Chest radiography same day FINDINGS: Cardiovascular: Aortic atherosclerosis. No aneurysm or dissection. No visible pulmonary emboli. The heart is not enlarged. No coronary artery calcification is seen. Mediastinum/Nodes: Prominence of the right hilar and paratracheal nodes, felt most likely reactive. See below discussion. Lungs/Pleura: Left lung is clear. There is right lower lobe collapse and pneumonia. Right hilar and paratracheal nodes are fell reactive to the right lower lobe pneumonia. Cannot rule out the possibility of underlying tumor with metastasis. Consider repeat examination after treatment if appropriate. Upper Abdomen: See results of abdominal study. Musculoskeletal: Negative IMPRESSION: Right lower lobe pneumonia and collapse. Enlargement of the right hilar and paratracheal nodes, most likely reactive to the right lower lobe pneumonia. I would suggest follow-up after treatment, if clinically appropriate, as underlying malignancy can't be excluded. Electronically Signed   By: Paulina Fusi M.D.   On: 01/05/2017 18:49   Ct Abdomen Pelvis W Contrast  Result Date: 01/05/2017 CLINICAL DATA:  Fever.  Rapid breathing.  Worsening mental status. EXAM: CT ABDOMEN AND PELVIS WITH CONTRAST TECHNIQUE: Multidetector CT imaging of the abdomen and pelvis was performed using the standard protocol following bolus administration of intravenous contrast. CONTRAST:  ISOVUE-300 IOPAMIDOL (ISOVUE-300) INJECTION 61% COMPARISON:  09/26/2015 FINDINGS: Lower chest: See results of chest CT.  Right lower lobe pneumonia. Hepatobiliary: Negative Pancreas: Normal Spleen: Normal Adrenals/Urinary Tract: Adrenal glands are normal. Kidneys are normal. No bladder abnormality seen. Catheter within the bladder. Stomach/Bowel: Gastrostomy tube in place. No evidence of ileus or obstruction. No focal bowel lesion. Vascular/Lymphatic: Aortic  atherosclerosis. No aneurysm. IVC is normal. No retroperitoneal adenopathy. Reproductive: Normal Other: No free fluid or air. Musculoskeletal: Negative.  Ordinary lumbar degenerative changes. IMPRESSION: No acute abdominal finding. Gastrostomy in place. Foley catheter in place. Electronically Signed   By: Paulina Fusi M.D.   On: 01/05/2017 18:52   Dg Chest Portable 1 View  Result Date: 01/05/2017 CLINICAL DATA:  Altered mental status and fever. EXAM: PORTABLE CHEST 1 VIEW COMPARISON:  09/25/2015 FINDINGS: Mild cardiomegaly. Chronic aortic atherosclerosis. No evidence heart failure or effusion. The left lung is clear. Question medial right lower lobe pneumonia, based on asymmetric retrocardiac density. Consider two-view radiography. IMPRESSION: Suspicion of medial right lower lobe pneumonia. Cardiomegaly and aortic atherosclerosis. Electronically Signed   By: Paulina Fusi M.D.   On: 01/05/2017 17:57    EKG:   Orders placed or performed during the hospital encounter of 01/05/17  . ED EKG  . ED EKG  . EKG 12-Lead  . EKG 12-Lead  . EKG 12-Lead  . EKG 12-Lead    ASSESSMENT AND PLAN:  #86.66 year old male patient with severe sepsis with high fevers, elevated lactic acid secondary to right lower lobe pneumonia, C. difficile colitis: Continue IV Rocephin, IV Flagyl.,, By mouth vancomycin through the PEG tube. IV fluids.No further fever. WBC is normal. Staph species in the blood but final cultures are pending. #2. History of previous CVA: Nonverbal. Continue tube feedings, patient has hypernatremia. Use free water flushes. #3 atrial fibrillation with RVR: Patient is off the Cardizem drip. Continue metoprolol, Xarelto.   #4 hypernatremia due to dehydration: Patient is receiving d5 Ringer lactate 1 50 cc an hour  Hypokalemia: Replace the potassium. . CODE STATUS DO NOT RESUSCITATE.  #5. elevated  troponin secondary to demand ischemia. Continue metoprolol, statins. Echocardiogram is done, we'll  follow the results.  #6. history of seizures: Continue Keppra.  Prognosis is poor, All the records are reviewed and case discussed with Care Management/Social Workerr. Management plans discussed with the patient, family and they are in agreement.  CODE STATUS: DO NOT RESUSCITATE  TOTAL TIME TAKING CARE OF THIS PATIENT:35 minutes.   POSSIBLE D/C IN 2-3 DAYS, DEPENDING ON CLINICAL CONDITION.   Katha Hamming M.D on 01/07/2017 at 11:34 AM  Between 7am to 6pm - Pager - [phone]  After 6pm go to [email or web address] - password [email or web address] [facility]  [facility]  Office  [phone]  CC: Primary care physician; Dimple Casey, MD   Note: This dictation was prepared with Dragon dictation along with smaller phrase technology. Any transcriptional errors that result from this process are unintentional.

## 2017-01-07 NOTE — Progress Notes (Signed)
PULMONARY / CRITICAL CARE MEDICINE   Name: Justin [name] MRN: [id number] DOB: [birth year]    ADMISSION DATE:  01/05/2017   CONSULTATION DATE:  01/05/17  REFERRING MD:  Dr. Juliene Pina  REASON: Sepsis and pneumonia  CHIEF COMPLAINT:  AMS and fever  HISTORY OF PRESENT ILLNESS:   This is a 66 y/o AA male, SNF resident who presented to the ED with c/o AMS and fever. Patient is non-verbal hence history is obtained from ED records. Patient was sent to the ED for declining mentation and fever x 1 day. Upon arrival in the ED, patient was tachycardic with a HR 152, febrile with a temp of 103.8 degrees F and tachypneic at 41 breaths/min. He was not hypoxic. ED worked shoed lactic acidosis, RLL pneumonia and a WBC of 16K. Patient was admitted to the SDU for further management. Upon arrival in the ICU, patient had 3 loose stools; no blood. He is awake and will occasionally nod to questions. He remains on RA and his blood pressure is normal.   SUBJECTIVE: Patient doing much better, has had no further episodes of hypotension. Has remained in atrial fibrillation with borderline ventricular response ranging from 90-120. Presently on Lopressor both through his PEG tube and IV boluses.  VITAL SIGNS: BP (!) 111/93 (BP Location: Right Arm)   Pulse (!) 144   Temp 98.6 F (37 C) (Axillary)   Resp 20   Ht  (1.676 m)   Wt 65 kg (143 lb 4.8 oz)   SpO2 95%   BMI 23.13 kg/m   INTAKE / OUTPUT: I/O last 3 completed shifts: In: 8495.3 [I.V.:4788.7; NG/GT:1290; IV Piggyback:2416.7] Out: 3000 [Urine:3000]  PHYSICAL EXAMINATION: General: chronically ill looking Neuro:  Awake, aphasic, right hemiparesis HEENT:  PERRLA, oral mucosa dry, tongue with white patches, lips dry  Cardiovascular:  Tachycardic, irregular, S1/S2, no MRG, +2 pulses, no edema Lungs:  Normal WOB, bilateral breath sounds, diminished in the bases, rt>>left, no wheezing Abdomen:  Non-distended, G-Tube in place with mild purulent drainage around  insertion site Musculoskeletal:  Contractures in upper and lower extremities, no swelling Skin: febrile to touch, dry, no lesions, feet dry   LABS:  BMET  Recent Labs Lab 01/06/17 0347 01/06/17 2305 01/07/17 0446  NA 154* 149* 150*  K 3.6 3.0* 3.2*  CL 123* 120* 121*  CO2 BUN 22* 17 17  CREATININE 0.65 0.51* 0.53*  GLUCOSE 111* 141* 145*    Electrolytes  Recent Labs Lab 01/05/17 2208 01/06/17 0347 01/06/17 2305 01/07/17 0446  CALCIUM  --  7.0* 7.9* 8.0*  MG 2.0  --  1.9 2.0  PHOS 2.6  --  1.3* 1.7*    CBC  Recent Labs Lab 01/05/17 1550 01/06/17 0347 01/07/17 0446  WBC 16.4* 14.8* 9.8  HGB 16.7 13.1 12.8*  HCT 50.1 40.2 38.7*  PLT 226 131* 126*    Coag's No results for input(s): APTT, INR in the last 168 hours.  Sepsis Markers  Recent Labs Lab 01/05/17 2208 01/06/17 0347 01/06/17 2305 01/07/17 0446  LATICACIDVEN 2.9* 2.6* 2.1* 2.5*  PROCALCITON 0.69 0.60  --  0.33    ABG No results for input(s): PHART, PCO2ART, PO2ART in the last 168 hours.  Liver Enzymes  Recent Labs Lab 01/05/17 1644  AST 68*  ALT 82*  ALKPHOS 55  BILITOT 0.8  ALBUMIN 2.5*    Cardiac Enzymes  Recent Labs Lab 01/06/17 0347 01/06/17 1031 01/06/17 2305  TROPONINI 0.13* 1.10* 0.05*    Glucose  Recent Labs Lab 01/05/17 2115  GLUCAP 100*    Imaging No results found.  STUDIES:  None  CULTURES: Blood x 2>> Urine>> Stool for c-diff>POSITIVE GI panel-Negative MRSA screen>negative  ANTIBIOTICS: Vancomycin> Zosyn> Flagyl> Oral vancomycin> Ceftriaxone given X 1  SIGNIFICANT EVENTS: 9/14>ADMITTED  LINES/TUBES: G-Tube PIVs Foley  Assessment and plan:  Sepsis. Most likely combination of pneumonia and C. difficile colitis. Blood pressure has stabilized, does not require any pressors. Presently on broad-spectrum coverage to include vancomycin oral, Zosyn and Flagyl  Atrial fibrillation. Ventricular response is still marginal. Will  increase Lopressor to 50 mg every 12 and continue on telemetry  Electrolyte abnormality. Hypokalemia and hypophosphatemia being replaced on protocol per pharmacy.  Hypernatremia.  At this point patient's hemodynamics have improved stable for telemetry transfer  Tora Kindred, DO 01/07/2017, 8:13 AM

## 2017-01-07 NOTE — Progress Notes (Signed)
Pharmacy Antibiotic Note  Justin [name] is a 66 y.o. male admitted on 01/05/2017 with aspiration pneumonia.  Pharmacy  consulted for Zosyn dosing.  Plan: Patient received Ceftriaxone 2 gram IV x 1, Zosyn x 1 and Vancomycin IV x 1 in ER. Continue Zosyn 3.375g IV q8h (4 hour infusion).     Height:  (167.6 cm) Weight: 143 lb 4.8 oz (65 kg) IBW/kg (Calculated) : 63.8  Temp (24hrs), Avg:98.5 F (36.9 C), Min:98.1 F (36.7 C), Max:99.3 F (37.4 C)   Recent Labs Lab 01/05/17 1550  01/05/17 1644 01/05/17 1846 01/05/17 2208 01/06/17 0347 01/06/17 2305 01/07/17 0446  WBC 16.4*  --   --   --   --  14.8*  --  9.8  CREATININE  --   --  0.85  --   --  0.65 0.51* 0.53*  LATICACIDVEN  --   < > 3.6* 4.4* 2.9* 2.6* 2.1* 2.5*  < > = values in this interval not displayed.  Estimated Creatinine Clearance: 79.8 mL/min (A) (by C-G formula based on SCr of 0.53 mg/dL (L)).    No Known Allergies  Antimicrobials this admission: CTX x 1 in ER     >>   Vanc x 1 in ER   >>   Zosyn 9/14 >>  Dose adjustments this admission:    Microbiology results: 9/14 BCx: Pend 9/14 UCx: Pend    Sputum:      MRSA PCR:   CT chest: Right lower lobe pneumonia and collapse  Thank you for allowing pharmacy to be a part of this patient's care.  Armelia Penton D 01/07/2017 9:06 AM

## 2017-01-07 NOTE — Progress Notes (Signed)
Confused. Nonverbal. No signs of pain. Sister at the bedside. G tube connected to tube feeds. Iso for PCR positive c diff. Meds crushed thry G tube. Iv abx. Turn q2h. Incontinent. Pt admission completed via sister. Pt has no further concerns at this time.

## 2017-01-07 NOTE — NC FL2 (Signed)
[place] MEDICAID FL2 LEVEL OF CARE SCREENING TOOL     IDENTIFICATION  Patient Name: Justin [name] Birthdate: 1950-10-30 Sex: male Admission Date (Current Location): 01/05/2017  [place] and [place] Number:  Chiropodist and Address:  [facility], [address] [place], [place] [address]      Provider Number: [id number]  Attending Physician Name and Address:  Katha Hamming, MD  Relative Name and Phone Number:  Tilden Fossa [phone])    Current Level of Care: Hospital Recommended Level of Care: Skilled Nursing Facility Prior Approval Number:    Date Approved/Denied: 03/16/15 PASRR Number: [id number]  Discharge Plan: SNF    Current Diagnoses: Patient Active Problem List   Diagnosis Date Noted  . Sepsis (HCC) 01/05/2017  . Gross hematuria   . Severe sepsis (HCC) 09/25/2015    Orientation RESPIRATION BLADDER Height & Weight     Self  Normal Incontinent Weight: 143 lb 4.8 oz (65 kg) Height:   (167.6 cm)  BEHAVIORAL SYMPTOMS/MOOD NEUROLOGICAL BOWEL NUTRITION STATUS      Incontinent Feeding tube (PEG)  AMBULATORY STATUS COMMUNICATION OF NEEDS Skin   Total Care Non-Verbally Normal                       Personal Care Assistance Level of Assistance  Bathing, Feeding, Dressing, Total care Bathing Assistance: Maximum assistance Feeding assistance: Maximum assistance Dressing Assistance: Maximum assistance Total Care Assistance: Maximum assistance   Functional Limitations Info  Speech     Speech Info: Impaired    SPECIAL CARE FACTORS FREQUENCY                       Contractures Contractures Info: Not present    Additional Factors Info  Code Status, Allergies, Psychotropic Code Status Info: DNR Allergies Info: NKA Psychotropic Info: Keppra, Ativan         Current Medications (01/07/2017):  This is the current hospital active medication list Current Facility-Administered Medications   Medication Dose Route Frequency Provider Last Rate Last Dose  . acetaminophen (TYLENOL) solution 650 mg  650 mg Per Tube Q6H PRN Lewie Loron, NP   650 mg at 01/06/17 0650  . acetaminophen (TYLENOL) suppository 650 mg  650 mg Rectal Q6H PRN Mody, Sital, MD      . atorvastatin (LIPITOR) tablet 20 mg  20 mg Oral QHS [name] Saran, MD   20 mg at 01/06/17 2225  . baclofen (LIORESAL) tablet 10 mg  10 mg Oral TID [name] Saran, MD   Stopped at 01/07/17 1000  . bisacodyl (DULCOLAX) EC tablet 5 mg  5 mg Oral Daily PRN [name] Saran, MD      . chlorhexidine (PERIDEX) 0.12 % solution 15 mL  15 mL Mouth Rinse BID Lewie Loron, NP   Stopped at 01/07/17 1000  . cholecalciferol (VITAMIN D) tablet 5,000 Units  5,000 Units Oral Daily Katha Hamming, MD   Stopped at 01/07/17 1000  . dextrose 5 % in lactated ringers infusion   Intravenous Continuous Marylou Flesher S, NP 150 mL/hr at 01/07/17 0800    . feeding supplement (JEVITY 1.2 CAL) liquid 1,000 mL  1,000 mL Per Tube Continuous Mody, Sital, MD 75 mL/hr at 01/07/17 0800 1,000 mL at 01/07/17 0800  . free water 150 mL  150 mL Per Tube Q8H Katha Hamming, MD   150 mL at 01/07/17 0628  . ipratropium-albuterol (DUONEB) 0.5-2.5 (3) MG/3ML nebulizer solution 3 mL  3 mL  Nebulization Q6H PRN [name] Saran, MD      . levETIRAcetam (KEPPRA) 100 MG/ML solution 500 mg  500 mg Per Tube BID [name] Saran, MD   Stopped at 01/07/17 1000  . LORazepam (ATIVAN) 2 MG/ML concentrated solution 1 mg  1 mg Oral Q6H PRN [name] Saran, MD      . MEDLINE mouth rinse  15 mL Mouth Rinse q12n4p Lewie Loron, NP      . Menthol (Topical Analgesic) 4 % GEL 1 application  1 application Apply externally BID PRN [name] Saran, MD      . metoprolol tartrate (LOPRESSOR) tablet 50 mg  50 mg Oral BID Conforti, John, DO   Stopped at 01/07/17 1000  . metroNIDAZOLE (FLAGYL) IVPB 500 mg  500 mg Intravenous Q8H Lewie Loron, NP   Stopped at 01/07/17 0155  . multivitamin with  minerals tablet 1 tablet  1 tablet Oral Daily [name] Saran, MD   Stopped at 01/07/17 1000  . nystatin (MYCOSTATIN) 100000 UNIT/ML suspension 500,000 Units  5 mL Oral QID [name] Saran, MD   Stopped at 01/07/17 1000  . ondansetron (ZOFRAN) tablet 4 mg  4 mg Oral Q6H PRN [name] Saran, MD       Or  . ondansetron (ZOFRAN) injection 4 mg  4 mg Intravenous Q6H PRN Mody, Sital, MD      . pantoprazole sodium (PROTONIX) 40 mg/20 mL oral suspension 40 mg  40 mg Per Tube BID [name] Saran, MD   Stopped at 01/07/17 1000  . piperacillin-tazobactam (ZOSYN) IVPB 3.375 g  3.375 g Intravenous Q8H [name] Saran, MD   Stopped at 01/07/17 0440  . potassium phosphate (monobasic) (K-PHOS ORIGINAL) tablet 1,000 mg  1,000 mg Per Tube TID WC & HS Lewie Loron, NP   Stopped at 01/07/17 0800  . rivaroxaban (XARELTO) tablet 20 mg  20 mg Oral Q supper [name] Saran, MD   20 mg at 01/06/17 1630  . senna-docusate (Senokot-S) tablet 1 tablet  1 tablet Oral QHS PRN [name] Saran, MD      . traMADol (ULTRAM) tablet 50 mg  50 mg Oral Q6H PRN [name] Saran, MD   50 mg at 01/06/17 1252  . vancomycin (VANCOCIN) 50 mg/mL oral solution 125 mg  125 mg Per Tube Q6H Tukov, Magadalene S, NP   125 mg at 01/07/17 1610     Discharge Medications: Please see discharge summary for a list of discharge medications.  Relevant Imaging Results:  Relevant Lab Results:   Additional Information SS# [id number]  Judi Cong, LCSW

## 2017-01-07 NOTE — Progress Notes (Signed)
MEDICATION RELATED CONSULT NOTE - INITIAL   Pharmacy Consult for electrolyte management Indication: hypophosphatemia, hypokalemia  No Known Allergies  Patient Measurements: Height:  (167.6 cm) Weight: 143 lb 4.8 oz (65 kg) IBW/kg (Calculated) : 63.8 Adjusted Body Weight: 65 kg  Vital Signs: Temp: 99.8 F (37.7 C) (09/16 1114) Temp Source: Oral (09/16 1114) BP: 128/70 (09/16 1114) Pulse Rate: 122 (09/16 1120) Intake/Output from previous day: 09/15 0701 - 09/16 0700 In: 4358.3 [I.V.:2768.3; NG/GT:1290; IV Piggyback:300] Out: 1585 [Urine:1585] Intake/Output from this shift: Total I/O In: 1457.5 [I.V.:857.5; NG/GT:600] Out: -   Labs:  Recent Labs  01/05/17 1550  01/05/17 1644 01/05/17 2208 01/06/17 0347 01/06/17 2305 01/07/17 0446  WBC 16.4*  --   --   --  14.8*  --  9.8  HGB 16.7  --   --   --  13.1  --  12.8*  HCT 50.1  --   --   --  40.2  --  38.7*  PLT 226  --   --   --  131*  --  126*  CREATININE  --   < > 0.85  --  0.65 0.51* 0.53*  MG  --   --   --  2.0  --  1.9 2.0  PHOS  --   --   --  2.6  --  1.3* 1.7*  ALBUMIN  --   --  2.5*  --   --   --   --   PROT  --   --  6.4*  --   --   --   --   AST  --   --  68*  --   --   --   --   ALT  --   --  82*  --   --   --   --   ALKPHOS  --   --  55  --   --   --   --   BILITOT  --   --  0.8  --   --   --   --   < > = values in this interval not displayed. Estimated Creatinine Clearance: 79.8 mL/min (A) (by C-G formula based on SCr of 0.53 mg/dL (L)).   Microbiology: Recent Results (from the past 720 hour(s))  Blood Culture (routine x 2)     Status: None (Preliminary result)   Collection Time: 01/05/17  3:50 PM  Result Value Ref Range Status   Specimen Description BLOOD BLOOD RIGHT WRIST  Final   Special Requests   Final    BOTTLES DRAWN AEROBIC AND ANAEROBIC Blood Culture adequate volume   Culture  Setup Time   Final    GRAM POSITIVE COCCI AEROBIC BOTTLE ONLY CRITICAL RESULT CALLED TO, READ BACK BY AND  VERIFIED WITH: DAVID BESANTI AT 0354 01/07/17.PMH    Culture GRAM POSITIVE COCCI  Final   Report Status PENDING  Incomplete  Blood Culture (routine x 2)     Status: None (Preliminary result)   Collection Time: 01/05/17  3:50 PM  Result Value Ref Range Status   Specimen Description BLOOD RIGHT ANTECUBITAL  Final   Special Requests   Final    BOTTLES DRAWN AEROBIC AND ANAEROBIC Blood Culture adequate volume   Culture NO GROWTH 2 DAYS  Final   Report Status PENDING  Incomplete  Urine culture     Status: None   Collection Time: 01/05/17  3:50 PM  Result Value Ref Range Status  Specimen Description URINE, RANDOM  Final   Special Requests NONE  Final   Culture   Final    NO GROWTH Performed at [facility], [address] [place], [place] [address]    Report Status 01/06/2017 FINAL  Final  Blood Culture ID Panel (Reflexed)     Status: Abnormal   Collection Time: 01/05/17  3:50 PM  Result Value Ref Range Status   Enterococcus species NOT DETECTED NOT DETECTED Final   Listeria monocytogenes NOT DETECTED NOT DETECTED Final   Staphylococcus species DETECTED (A) NOT DETECTED Final    Comment: Methicillin (oxacillin) susceptible coagulase negative staphylococcus. Possible blood culture contaminant (unless isolated from more than one blood culture draw or clinical case suggests pathogenicity). No antibiotic treatment is indicated for blood  culture contaminants. CRITICAL RESULT CALLED TO, READ BACK BY AND VERIFIED WITH: DAVID BESANTI AT 0354 01/07/17.PMH    Staphylococcus aureus NOT DETECTED NOT DETECTED Final   Methicillin resistance NOT DETECTED NOT DETECTED Final   Streptococcus species NOT DETECTED NOT DETECTED Final   Streptococcus agalactiae NOT DETECTED NOT DETECTED Final   Streptococcus pneumoniae NOT DETECTED NOT DETECTED Final   Streptococcus pyogenes NOT DETECTED NOT DETECTED Final   Acinetobacter baumannii NOT DETECTED NOT DETECTED Final   Enterobacteriaceae species NOT  DETECTED NOT DETECTED Final   Enterobacter cloacae complex NOT DETECTED NOT DETECTED Final   Escherichia coli NOT DETECTED NOT DETECTED Final   Klebsiella oxytoca NOT DETECTED NOT DETECTED Final   Klebsiella pneumoniae NOT DETECTED NOT DETECTED Final   Proteus species NOT DETECTED NOT DETECTED Final   Serratia marcescens NOT DETECTED NOT DETECTED Final   Haemophilus influenzae NOT DETECTED NOT DETECTED Final   Neisseria meningitidis NOT DETECTED NOT DETECTED Final   Pseudomonas aeruginosa NOT DETECTED NOT DETECTED Final   Candida albicans NOT DETECTED NOT DETECTED Final   Candida glabrata NOT DETECTED NOT DETECTED Final   Candida krusei NOT DETECTED NOT DETECTED Final   Candida parapsilosis NOT DETECTED NOT DETECTED Final   Candida tropicalis NOT DETECTED NOT DETECTED Final  MRSA PCR Screening     Status: None   Collection Time: 01/05/17 11:49 PM  Result Value Ref Range Status   MRSA by PCR NEGATIVE NEGATIVE Final    Comment:        The GeneXpert MRSA Assay (FDA approved for NASAL specimens only), is one component of a comprehensive MRSA colonization surveillance program. It is not intended to diagnose MRSA infection nor to guide or monitor treatment for MRSA infections.   C difficile quick scan w PCR reflex     Status: Abnormal   Collection Time: 01/05/17 11:49 PM  Result Value Ref Range Status   C Diff antigen POSITIVE (A) NEGATIVE Final   C Diff toxin NEGATIVE NEGATIVE Final   C Diff interpretation Results are indeterminate. See PCR results.  Final  Gastrointestinal Panel by PCR , Stool     Status: None   Collection Time: 01/05/17 11:49 PM  Result Value Ref Range Status   Campylobacter species NOT DETECTED NOT DETECTED Final   Plesimonas shigelloides NOT DETECTED NOT DETECTED Final   Salmonella species NOT DETECTED NOT DETECTED Final   Yersinia enterocolitica NOT DETECTED NOT DETECTED Final   Vibrio species NOT DETECTED NOT DETECTED Final   Vibrio cholerae NOT DETECTED  NOT DETECTED Final   Enteroaggregative E coli (EAEC) NOT DETECTED NOT DETECTED Final   Enteropathogenic E coli (EPEC) NOT DETECTED NOT DETECTED Final   Enterotoxigenic E coli (ETEC) NOT  DETECTED NOT DETECTED Final   Shiga like toxin producing E coli (STEC) NOT DETECTED NOT DETECTED Final   Shigella/Enteroinvasive E coli (EIEC) NOT DETECTED NOT DETECTED Final   Cryptosporidium NOT DETECTED NOT DETECTED Final   Cyclospora cayetanensis NOT DETECTED NOT DETECTED Final   Entamoeba histolytica NOT DETECTED NOT DETECTED Final   Giardia lamblia NOT DETECTED NOT DETECTED Final   Adenovirus F40/41 NOT DETECTED NOT DETECTED Final   Astrovirus NOT DETECTED NOT DETECTED Final   Norovirus GI/GII NOT DETECTED NOT DETECTED Final   Rotavirus A NOT DETECTED NOT DETECTED Final   Sapovirus (I, II, IV, and V) NOT DETECTED NOT DETECTED Final  Clostridium Difficile by PCR     Status: Abnormal   Collection Time: 01/05/17 11:49 PM  Result Value Ref Range Status   Toxigenic C Difficile by pcr POSITIVE (A) NEGATIVE Final    Comment: Positive for toxigenic C. difficile with little to no toxin production. Only treat if clinical presentation suggests symptomatic illness.    Medical History: Past Medical History:  Diagnosis Date  . Atrial fibrillation (HCC)   . Hyperlipidemia   . Hypertension   . Seizure (HCC)    ON KEPPRA  . Stroke [facility])     Medications:  Scheduled:  . atorvastatin  20 mg Oral QHS  . baclofen  10 mg Oral TID  . chlorhexidine  15 mL Mouth Rinse BID  . cholecalciferol  5,000 Units Oral Daily  . free water  200 mL Per Tube Q6H  . levETIRAcetam  500 mg Per Tube BID  . mouth rinse  15 mL Mouth Rinse q12n4p  . metoprolol tartrate  50 mg Oral BID  . [START ON 01/08/2017] multivitamin with minerals  15 mL Per Tube Daily  . nystatin  5 mL Oral QID  . pantoprazole sodium  40 mg Per Tube BID  . potassium & sodium phosphates  2 packet Oral TID WC & HS  . rivaroxaban  20 mg Oral Q supper  .  vancomycin  125 mg Per Tube Q6H    Assessment: Patient admitted as a code sepsis s/t RLL PNA and C diff colitis. Patient has hypokalemia and hypophosphatemia.  Goal of Therapy:  K+ 3.5 - 5.0 Phos 2.5 - 4.5  Plan:  Patient was originally ordered Kphos 30 mmol IV -- but patient has G-tube order was changed to Kphos neutral. Will continue Phos-NaK packet:  2 packets three times daily w/ meals down G tube for 2 days. Will monitor electrolytes w/ am labs.    Bari Mantis PharmD Clinical Pharmacist 01/07/2017

## 2017-01-07 NOTE — Progress Notes (Signed)
PHARMACY - PHYSICIAN COMMUNICATION CRITICAL VALUE ALERT - BLOOD CULTURE IDENTIFICATION (BCID)  Results for orders placed or performed during the hospital encounter of 01/05/17  Blood Culture ID Panel (Reflexed) (Collected: 01/05/2017  3:50 PM)  Result Value Ref Range   Enterococcus species NOT DETECTED NOT DETECTED   Listeria monocytogenes NOT DETECTED NOT DETECTED   Staphylococcus species DETECTED (A) NOT DETECTED   Staphylococcus aureus NOT DETECTED NOT DETECTED   Methicillin resistance NOT DETECTED NOT DETECTED   Streptococcus species NOT DETECTED NOT DETECTED   Streptococcus agalactiae NOT DETECTED NOT DETECTED   Streptococcus pneumoniae NOT DETECTED NOT DETECTED   Streptococcus pyogenes NOT DETECTED NOT DETECTED   Acinetobacter baumannii NOT DETECTED NOT DETECTED   Enterobacteriaceae species NOT DETECTED NOT DETECTED   Enterobacter cloacae complex NOT DETECTED NOT DETECTED   Escherichia coli NOT DETECTED NOT DETECTED   Klebsiella oxytoca NOT DETECTED NOT DETECTED   Klebsiella pneumoniae NOT DETECTED NOT DETECTED   Proteus species NOT DETECTED NOT DETECTED   Serratia marcescens NOT DETECTED NOT DETECTED   Haemophilus influenzae NOT DETECTED NOT DETECTED   Neisseria meningitidis NOT DETECTED NOT DETECTED   Pseudomonas aeruginosa NOT DETECTED NOT DETECTED   Candida albicans NOT DETECTED NOT DETECTED   Candida glabrata NOT DETECTED NOT DETECTED   Candida krusei NOT DETECTED NOT DETECTED   Candida parapsilosis NOT DETECTED NOT DETECTED   Candida tropicalis NOT DETECTED NOT DETECTED    Name of physician (or Provider) Contacted: Maude Leriche  Changes to prescribed antibiotics required: No changes at this time Patient is on zosyn IV for sepsis And Flagyl IV, vanco PO for C diff  Thomasene Ripple, PharmD, BCPS Clinical Pharmacist 01/07/2017

## 2017-01-07 NOTE — Progress Notes (Signed)
Pt being transferred to room 241. Report called to Leslie Dales, Charity fundraiser. Pt, belongings and medication transferred to room 241 without incident.

## 2017-01-07 NOTE — Progress Notes (Addendum)
MEDICATION RELATED CONSULT NOTE - INITIAL   Pharmacy Consult for electrolyte management Indication: hypophosphatemia, hypokalemia  No Known Allergies  Patient Measurements: Height:  (167.6 cm) Weight: 143 lb 4.8 oz (65 kg) IBW/kg (Calculated) : 63.8 Adjusted Body Weight: 65 kg  Vital Signs: Temp: 98.3 F (36.8 C) (09/16 0200) Temp Source: Axillary (09/16 0200) BP: 108/71 (09/16 0300) Pulse Rate: 100 (09/16 0300) Intake/Output from previous day: 09/15 0701 - 09/16 0700 In: 4358.3 [I.V.:2768.3; NG/GT:1290; IV Piggyback:300] Out: 1585 [Urine:1585] Intake/Output from this shift: Total I/O In: 1800 [I.V.:1200; NG/GT:600] Out: -   Labs:  Recent Labs  01/05/17 1550  01/05/17 1644 01/05/17 2208 01/06/17 0347 01/06/17 2305 01/07/17 0446  WBC 16.4*  --   --   --  14.8*  --  9.8  HGB 16.7  --   --   --  13.1  --  12.8*  HCT 50.1  --   --   --  40.2  --  38.7*  PLT 226  --   --   --  131*  --  126*  CREATININE  --   < > 0.85  --  0.65 0.51* 0.53*  MG  --   --   --  2.0  --  1.9 2.0  PHOS  --   --   --  2.6  --  1.3* 1.7*  ALBUMIN  --   --  2.5*  --   --   --   --   PROT  --   --  6.4*  --   --   --   --   AST  --   --  68*  --   --   --   --   ALT  --   --  82*  --   --   --   --   ALKPHOS  --   --  55  --   --   --   --   BILITOT  --   --  0.8  --   --   --   --   < > = values in this interval not displayed. Estimated Creatinine Clearance: 79.8 mL/min (A) (by C-G formula based on SCr of 0.53 mg/dL (L)).   Microbiology: Recent Results (from the past 720 hour(s))  Blood Culture (routine x 2)     Status: None (Preliminary result)   Collection Time: 01/05/17  3:50 PM  Result Value Ref Range Status   Specimen Description BLOOD BLOOD RIGHT WRIST  Final   Special Requests   Final    BOTTLES DRAWN AEROBIC AND ANAEROBIC Blood Culture adequate volume   Culture  Setup Time   Final    Organism ID to follow GRAM POSITIVE COCCI AEROBIC BOTTLE ONLY CRITICAL RESULT CALLED  TO, READ BACK BY AND VERIFIED WITH: Adamary Savary AT 0354 01/07/17.PMH    Culture GRAM POSITIVE COCCI  Final   Report Status PENDING  Incomplete  Blood Culture (routine x 2)     Status: None (Preliminary result)   Collection Time: 01/05/17  3:50 PM  Result Value Ref Range Status   Specimen Description BLOOD RIGHT ANTECUBITAL  Final   Special Requests   Final    BOTTLES DRAWN AEROBIC AND ANAEROBIC Blood Culture adequate volume   Culture NO GROWTH < 24 HOURS  Final   Report Status PENDING  Incomplete  Urine culture     Status: None   Collection Time: 01/05/17  3:50 PM  Result  Value Ref Range Status   Specimen Description URINE, RANDOM  Final   Special Requests NONE  Final   Culture   Final    NO GROWTH Performed at [facility], [address], [place], [place] [address]    Report Status 01/06/2017 FINAL  Final  Blood Culture ID Panel (Reflexed)     Status: Abnormal   Collection Time: 01/05/17  3:50 PM  Result Value Ref Range Status   Enterococcus species NOT DETECTED NOT DETECTED Final   Listeria monocytogenes NOT DETECTED NOT DETECTED Final   Staphylococcus species DETECTED (A) NOT DETECTED Final    Comment: Methicillin (oxacillin) susceptible coagulase negative staphylococcus. Possible blood culture contaminant (unless isolated from more than one blood culture draw or clinical case suggests pathogenicity). No antibiotic treatment is indicated for blood  culture contaminants. CRITICAL RESULT CALLED TO, READ BACK BY AND VERIFIED WITH: Jovanie Verge AT 0354 01/07/17.PMH    Staphylococcus aureus NOT DETECTED NOT DETECTED Final   Methicillin resistance NOT DETECTED NOT DETECTED Final   Streptococcus species NOT DETECTED NOT DETECTED Final   Streptococcus agalactiae NOT DETECTED NOT DETECTED Final   Streptococcus pneumoniae NOT DETECTED NOT DETECTED Final   Streptococcus pyogenes NOT DETECTED NOT DETECTED Final   Acinetobacter baumannii NOT DETECTED NOT DETECTED Final    Enterobacteriaceae species NOT DETECTED NOT DETECTED Final   Enterobacter cloacae complex NOT DETECTED NOT DETECTED Final   Escherichia coli NOT DETECTED NOT DETECTED Final   Klebsiella oxytoca NOT DETECTED NOT DETECTED Final   Klebsiella pneumoniae NOT DETECTED NOT DETECTED Final   Proteus species NOT DETECTED NOT DETECTED Final   Serratia marcescens NOT DETECTED NOT DETECTED Final   Haemophilus influenzae NOT DETECTED NOT DETECTED Final   Neisseria meningitidis NOT DETECTED NOT DETECTED Final   Pseudomonas aeruginosa NOT DETECTED NOT DETECTED Final   Candida albicans NOT DETECTED NOT DETECTED Final   Candida glabrata NOT DETECTED NOT DETECTED Final   Candida krusei NOT DETECTED NOT DETECTED Final   Candida parapsilosis NOT DETECTED NOT DETECTED Final   Candida tropicalis NOT DETECTED NOT DETECTED Final  MRSA PCR Screening     Status: None   Collection Time: 01/05/17 11:49 PM  Result Value Ref Range Status   MRSA by PCR NEGATIVE NEGATIVE Final    Comment:        The GeneXpert MRSA Assay (FDA approved for NASAL specimens only), is one component of a comprehensive MRSA colonization surveillance program. It is not intended to diagnose MRSA infection nor to guide or monitor treatment for MRSA infections.   C difficile quick scan w PCR reflex     Status: Abnormal   Collection Time: 01/05/17 11:49 PM  Result Value Ref Range Status   C Diff antigen POSITIVE (A) NEGATIVE Final   C Diff toxin NEGATIVE NEGATIVE Final   C Diff interpretation Results are indeterminate. See PCR results.  Final  Gastrointestinal Panel by PCR , Stool     Status: None   Collection Time: 01/05/17 11:49 PM  Result Value Ref Range Status   Campylobacter species NOT DETECTED NOT DETECTED Final   Plesimonas shigelloides NOT DETECTED NOT DETECTED Final   Salmonella species NOT DETECTED NOT DETECTED Final   Yersinia enterocolitica NOT DETECTED NOT DETECTED Final   Vibrio species NOT DETECTED NOT DETECTED Final    Vibrio cholerae NOT DETECTED NOT DETECTED Final   Enteroaggregative E coli (EAEC) NOT DETECTED NOT DETECTED Final   Enteropathogenic E coli (EPEC) NOT DETECTED NOT DETECTED Final  Enterotoxigenic E coli (ETEC) NOT DETECTED NOT DETECTED Final   Shiga like toxin producing E coli (STEC) NOT DETECTED NOT DETECTED Final   Shigella/Enteroinvasive E coli (EIEC) NOT DETECTED NOT DETECTED Final   Cryptosporidium NOT DETECTED NOT DETECTED Final   Cyclospora cayetanensis NOT DETECTED NOT DETECTED Final   Entamoeba histolytica NOT DETECTED NOT DETECTED Final   Giardia lamblia NOT DETECTED NOT DETECTED Final   Adenovirus F40/41 NOT DETECTED NOT DETECTED Final   Astrovirus NOT DETECTED NOT DETECTED Final   Norovirus GI/GII NOT DETECTED NOT DETECTED Final   Rotavirus A NOT DETECTED NOT DETECTED Final   Sapovirus (I, II, IV, and V) NOT DETECTED NOT DETECTED Final  Clostridium Difficile by PCR     Status: Abnormal   Collection Time: 01/05/17 11:49 PM  Result Value Ref Range Status   Toxigenic C Difficile by pcr POSITIVE (A) NEGATIVE Final    Comment: Positive for toxigenic C. difficile with little to no toxin production. Only treat if clinical presentation suggests symptomatic illness.    Medical History: Past Medical History:  Diagnosis Date  . Atrial fibrillation (HCC)   . Hyperlipidemia   . Hypertension   . Seizure (HCC)    ON KEPPRA  . Stroke [facility])     Medications:  Scheduled:  . atorvastatin  20 mg Oral QHS  . baclofen  10 mg Oral TID  . chlorhexidine  15 mL Mouth Rinse BID  . cholecalciferol  5,000 Units Oral Daily  . free water  150 mL Per Tube Q8H  . levETIRAcetam  500 mg Per Tube BID  . mouth rinse  15 mL Mouth Rinse q12n4p  . metoprolol tartrate  25 mg Oral BID  . multivitamin with minerals  1 tablet Oral Daily  . nystatin  5 mL Oral QID  . pantoprazole sodium  40 mg Per Tube BID  . rivaroxaban  20 mg Oral Q supper  . vancomycin  125 mg Per Tube Q6H     Assessment: Patient admitted as a code sepsis s/t RLL PNA and C diff colitis. Patient has hypokalemia and hypophosphatemia.  Goal of Therapy:  K+ 3.5 - 5.0 Phos 2.5 - 4.5  Plan:  Patient was originally ordered Kphos 30 mmol IV -- but patient has G-tube order was changed to Kphos neutral. Will continue Kphos 1g three times daily w/ meals down G tube for 2 days. Estimated K = 44.4 mEq total Will monitor electrolytes w/ am labs.  Thomasene Ripple, PharmD, BCPS Clinical Pharmacist 01/07/2017

## 2017-01-08 ENCOUNTER — Encounter: Payer: Self-pay | Admitting: *Deleted

## 2017-01-08 LAB — RENAL FUNCTION PANEL
ANION GAP: 6 (ref 5–15)
Albumin: 2.4 g/dL — ABNORMAL LOW (ref 3.5–5.0)
BUN: 11 mg/dL (ref 6–20)
CHLORIDE: 112 mmol/L — AB (ref 101–111)
CO2: 26 mmol/L (ref 22–32)
Calcium: 8.1 mg/dL — ABNORMAL LOW (ref 8.9–10.3)
Creatinine, Ser: 0.56 mg/dL — ABNORMAL LOW (ref 0.61–1.24)
GFR calc Af Amer: >60 mL/min (ref >60)
GFR calc non Af Amer: >60 mL/min (ref >60)
GLUCOSE: 139 mg/dL — AB (ref 65–99)
POTASSIUM: 3.1 mmol/L — AB (ref 3.5–5.1)
Phosphorus: 3 mg/dL (ref 2.5–4.6)
SODIUM: 144 mmol/L (ref 135–145)

## 2017-01-08 LAB — MAGNESIUM: Magnesium: 1.9 mg/dL (ref 1.7–2.4)

## 2017-01-08 MED ORDER — VANCOMYCIN 50 MG/ML ORAL SOLUTION
500.0000 mg | Freq: Four times a day (QID) | ORAL | Status: DC
  Administered 2017-01-08 – 2017-01-10 (×6): 500 mg
  Filled 2017-01-08 (×9): qty 10

## 2017-01-08 MED ORDER — TWOCAL HN PO LIQD
1000.0000 mL | Freq: Every day | ORAL | Status: DC
  Administered 2017-01-09: 17:00:00
  Administered 2017-01-09: 1000 mL
  Administered 2017-01-09 (×4)
  Administered 2017-01-10: 1000 mL
  Filled 2017-01-08 (×2): qty 1185

## 2017-01-08 MED ORDER — METOPROLOL TARTRATE 5 MG/5ML IV SOLN
5.0000 mg | INTRAVENOUS | Status: DC | PRN
  Administered 2017-01-08: 5 mg via INTRAVENOUS
  Filled 2017-01-08 (×2): qty 5

## 2017-01-08 NOTE — Progress Notes (Signed)
Nutrition Follow-up  DOCUMENTATION CODES:   Not applicable  INTERVENTION:  Will switch patient to TwoCalHN (non-formulary, patient's home regimen) tomorrow at 0800. Will run from 0800-1800 at 187mL/hr via G-tube, provides 2000 calories, 83.5 grams of protein, H2O, 5 grams of fiber.  free water Q4Hrs, provides an additional H2O for a total of free water  Recommend replete potassium (3.1)  NUTRITION DIAGNOSIS:   Inadequate oral intake related to dysphagia as evidenced by other (see comment) (reliance on TF via G-tube to meet calorie/protein needs, baseline diet of dysphagia 1(puree) with nectar-thick liquids). 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 Difficile colitis.  Spoke to Mr. Ginsberg at bedside.  Will switch him to twocal HN in the morning.  Continues to complain of abdominal pain and gas, abdomen distended. Discussed with RN. Will continue to monitor.  Labs reviewed:  K 3.1  Medications reviewed and include:  Vitamin D 5000 units, Liquid MVI K and Na Phosphate 2 packets TID   Diet Order:  Diet NPO time specified  Skin:  Reviewed, no issues  Last BM:  01/07/2017 - medium type 6  Height:   Ht Readings from Last 1 Encounters:  01/05/17  (1.676 m)    Weight:   Wt Readings from Last 1 Encounters:  01/05/17 143 lb 4.8 oz (65 kg)    Ideal Body Weight:  64.5 kg  BMI:  Body mass index is 23.13  kg/m.  Estimated Nutritional Needs:   Kcal:  1780-2050 (MSJ x 1.3-1.5)  Protein:  80-90 grams (1.2-1.4 grams/kg)  Fluid:  1.9-2.2 L/day (30-35 ml/kg)  EDUCATION NEEDS:   No education needs identified at this time  Dionne Ano. [place] Vuncannon, MS, RD LDN Inpatient Clinical Dietitian Pager [phone]

## 2017-01-08 NOTE — Progress Notes (Signed)
MEDICATION RELATED CONSULT NOTE - INITIAL   Pharmacy Consult for electrolyte management Indication: hypophosphatemia, hypokalemia  No Known Allergies  Patient Measurements: Height:  (167.6 cm) Weight: 143 lb 4.8 oz (65 kg) IBW/kg (Calculated) : 63.8 Adjusted Body Weight: 65 kg  Vital Signs: Temp: 98.5 F (36.9 C) (09/17 0609) Temp Source: Oral (09/17 0609) BP: 120/61 (09/17 0609) Pulse Rate: 97 (09/17 0609) Intake/Output from previous day: 09/16 0701 - 09/17 0700 In: 5091.7 [I.V.:1782.5; NG/GT:3009.2; IV Piggyback:300] Out: -  Intake/Output from this shift: No intake/output data recorded.  Labs:  Recent Labs  01/05/17 1550  01/05/17 1644  01/06/17 0347 01/06/17 2305 01/07/17 0446 01/08/17 0510  WBC 16.4*  --   --   --  14.8*  --  9.8  --   HGB 16.7  --   --   --  13.1  --  12.8*  --   HCT 50.1  --   --   --  40.2  --  38.7*  --   PLT 226  --   --   --  131*  --  126*  --   CREATININE  --   < > 0.85  --  0.65 0.51* 0.53* 0.56*  MG  --   --   --   < >  --  1.9 2.0 1.9  PHOS  --   --   --   < >  --  1.3* 1.7* 3.0  ALBUMIN  --   --  2.5*  --   --   --   --  2.4*  PROT  --   --  6.4*  --   --   --   --   --   AST  --   --  68*  --   --   --   --   --   ALT  --   --  82*  --   --   --   --   --   ALKPHOS  --   --  55  --   --   --   --   --   BILITOT  --   --  0.8  --   --   --   --   --   < > = values in this interval not displayed. Estimated Creatinine Clearance: 79.8 mL/min (A) (by C-G formula based on SCr of 0.56 mg/dL (L)).   Microbiology: Recent Results (from the past 720 hour(s))  Blood Culture (routine x 2)     Status: None (Preliminary result)   Collection Time: 01/05/17  3:50 PM  Result Value Ref Range Status   Specimen Description BLOOD BLOOD RIGHT WRIST  Final   Special Requests   Final    BOTTLES DRAWN AEROBIC AND ANAEROBIC Blood Culture adequate volume   Culture  Setup Time   Final    GRAM POSITIVE COCCI AEROBIC BOTTLE ONLY CRITICAL RESULT  CALLED TO, READ BACK BY AND VERIFIED WITH: DAVID BESANTI AT 0354 01/07/17.PMH    Culture GRAM POSITIVE COCCI  Final   Report Status PENDING  Incomplete  Blood Culture (routine x 2)     Status: None (Preliminary result)   Collection Time: 01/05/17  3:50 PM  Result Value Ref Range Status   Specimen Description BLOOD RIGHT ANTECUBITAL  Final   Special Requests   Final    BOTTLES DRAWN AEROBIC AND ANAEROBIC Blood Culture adequate volume   Culture NO GROWTH 3 DAYS  Final  Report Status PENDING  Incomplete  Urine culture     Status: None   Collection Time: 01/05/17  3:50 PM  Result Value Ref Range Status   Specimen Description URINE, RANDOM  Final   Special Requests NONE  Final   Culture   Final    NO GROWTH Performed at [facility], [address], [place], [place] [address]    Report Status 01/06/2017 FINAL  Final  Blood Culture ID Panel (Reflexed)     Status: Abnormal   Collection Time: 01/05/17  3:50 PM  Result Value Ref Range Status   Enterococcus species NOT DETECTED NOT DETECTED Final   Listeria monocytogenes NOT DETECTED NOT DETECTED Final   Staphylococcus species DETECTED (A) NOT DETECTED Final    Comment: Methicillin (oxacillin) susceptible coagulase negative staphylococcus. Possible blood culture contaminant (unless isolated from more than one blood culture draw or clinical case suggests pathogenicity). No antibiotic treatment is indicated for blood  culture contaminants. CRITICAL RESULT CALLED TO, READ BACK BY AND VERIFIED WITH: DAVID BESANTI AT 0354 01/07/17.PMH    Staphylococcus aureus NOT DETECTED NOT DETECTED Final   Methicillin resistance NOT DETECTED NOT DETECTED Final   Streptococcus species NOT DETECTED NOT DETECTED Final   Streptococcus agalactiae NOT DETECTED NOT DETECTED Final   Streptococcus pneumoniae NOT DETECTED NOT DETECTED Final   Streptococcus pyogenes NOT DETECTED NOT DETECTED Final   Acinetobacter baumannii NOT DETECTED NOT DETECTED Final    Enterobacteriaceae species NOT DETECTED NOT DETECTED Final   Enterobacter cloacae complex NOT DETECTED NOT DETECTED Final   Escherichia coli NOT DETECTED NOT DETECTED Final   Klebsiella oxytoca NOT DETECTED NOT DETECTED Final   Klebsiella pneumoniae NOT DETECTED NOT DETECTED Final   Proteus species NOT DETECTED NOT DETECTED Final   Serratia marcescens NOT DETECTED NOT DETECTED Final   Haemophilus influenzae NOT DETECTED NOT DETECTED Final   Neisseria meningitidis NOT DETECTED NOT DETECTED Final   Pseudomonas aeruginosa NOT DETECTED NOT DETECTED Final   Candida albicans NOT DETECTED NOT DETECTED Final   Candida glabrata NOT DETECTED NOT DETECTED Final   Candida krusei NOT DETECTED NOT DETECTED Final   Candida parapsilosis NOT DETECTED NOT DETECTED Final   Candida tropicalis NOT DETECTED NOT DETECTED Final  MRSA PCR Screening     Status: None   Collection Time: 01/05/17 11:49 PM  Result Value Ref Range Status   MRSA by PCR NEGATIVE NEGATIVE Final    Comment:        The GeneXpert MRSA Assay (FDA approved for NASAL specimens only), is one component of a comprehensive MRSA colonization surveillance program. It is not intended to diagnose MRSA infection nor to guide or monitor treatment for MRSA infections.   C difficile quick scan w PCR reflex     Status: Abnormal   Collection Time: 01/05/17 11:49 PM  Result Value Ref Range Status   C Diff antigen POSITIVE (A) NEGATIVE Final   C Diff toxin NEGATIVE NEGATIVE Final   C Diff interpretation Results are indeterminate. See PCR results.  Final  Gastrointestinal Panel by PCR , Stool     Status: None   Collection Time: 01/05/17 11:49 PM  Result Value Ref Range Status   Campylobacter species NOT DETECTED NOT DETECTED Final   Plesimonas shigelloides NOT DETECTED NOT DETECTED Final   Salmonella species NOT DETECTED NOT DETECTED Final   Yersinia enterocolitica NOT DETECTED NOT DETECTED Final   Vibrio species NOT DETECTED NOT DETECTED Final    Vibrio cholerae NOT DETECTED NOT DETECTED  Final   Enteroaggregative E coli (EAEC) NOT DETECTED NOT DETECTED Final   Enteropathogenic E coli (EPEC) NOT DETECTED NOT DETECTED Final   Enterotoxigenic E coli (ETEC) NOT DETECTED NOT DETECTED Final   Shiga like toxin producing E coli (STEC) NOT DETECTED NOT DETECTED Final   Shigella/Enteroinvasive E coli (EIEC) NOT DETECTED NOT DETECTED Final   Cryptosporidium NOT DETECTED NOT DETECTED Final   Cyclospora cayetanensis NOT DETECTED NOT DETECTED Final   Entamoeba histolytica NOT DETECTED NOT DETECTED Final   Giardia lamblia NOT DETECTED NOT DETECTED Final   Adenovirus F40/41 NOT DETECTED NOT DETECTED Final   Astrovirus NOT DETECTED NOT DETECTED Final   Norovirus GI/GII NOT DETECTED NOT DETECTED Final   Rotavirus A NOT DETECTED NOT DETECTED Final   Sapovirus (I, II, IV, and V) NOT DETECTED NOT DETECTED Final  Clostridium Difficile by PCR     Status: Abnormal   Collection Time: 01/05/17 11:49 PM  Result Value Ref Range Status   Toxigenic C Difficile by pcr POSITIVE (A) NEGATIVE Final    Comment: Positive for toxigenic C. difficile with little to no toxin production. Only treat if clinical presentation suggests symptomatic illness.    Medical History: Past Medical History:  Diagnosis Date  . Atrial fibrillation (HCC)   . Hyperlipidemia   . Hypertension   . Seizure (HCC)    ON KEPPRA  . Stroke [facility])     Medications:  Scheduled:  . atorvastatin  20 mg Oral QHS  . baclofen  10 mg Oral TID  . chlorhexidine  15 mL Mouth Rinse BID  . cholecalciferol  5,000 Units Oral Daily  . free water  200 mL Per Tube Q6H  . levETIRAcetam  500 mg Per Tube BID  . mouth rinse  15 mL Mouth Rinse q12n4p  . metoprolol tartrate  50 mg Oral BID  . multivitamin with minerals  15 mL Per Tube Daily  . nystatin  5 mL Oral QID  . pantoprazole sodium  40 mg Per Tube BID  . potassium & sodium phosphates  2 packet Per Tube TID WC & HS  . rivaroxaban  20 mg Oral Q  supper  . vancomycin  125 mg Per Tube Q6H    Assessment: Patient admitted as a code sepsis s/t RLL PNA and C diff colitis. Patient has hypokalemia and hypophosphatemia.  Goal of Therapy:  K+ 3.5 - 5.0 Phos 2.5 - 4.5  Plan:  Patient was originally ordered Kphos 30 mmol IV -- but patient has G-tube order was changed to Kphos neutral. Will continue Phos-NaK packet:  2 packets three times daily w/ meals down G tube for 2 days. Will monitor electrolytes w/ am labs.  9/17 0510 K 3.1, Ca 8.1, albumin 2.4, adjusted Ca 9.68, Mg 1.9, Phos 2.4. Continue current order for Phos-Nak 2 packets per tube ACHS. Will recheck electrolytes tomorrow with AM labs.   Sanjana Folz A. [place], [place].D., BCPS Clinical Pharmacist 01/08/2017

## 2017-01-08 NOTE — Progress Notes (Signed)
Notified MD of hr going up into the 140's, non sustaining. Orders placed. Will continue to monitor and assess.

## 2017-01-08 NOTE — Progress Notes (Signed)
[facility] - [place] at [facility]   PATIENT NAME: Justin [name]    MR#:  [id number]  DATE OF BIRTH:  06/08/1950  SUBJECTIVE: Admitted for fever, weakness. Found to have sepsis. patient has history of previous CVA, nonv erbal. Nods head to questions.. Slightly tachycardic. Diarrhea resolved however has hypoxia And tachycardia.  CHIEF COMPLAINT:   Chief Complaint  Patient presents with  . Code Sepsis    REVIEW OF SYSTEMS:   Review of Systems  Unable to perform ROS: Patient nonverbal     DRUG ALLERGIES:  No Known Allergies  VITALS:  Blood pressure 128/76, pulse 93, temperature 98.6 F (37 C), temperature source Axillary, resp. rate (!) 22, height  (1.676 m), weight 65 kg (143 lb 4.8 oz), SpO2 94 %.  PHYSICAL EXAMINATION:  GENERAL:  66 y.o.-year-old patient lying in the bed with no acute distress.nonverbal. Nods his head. Alert, awake.  EYES: Pupils equal, round, reactive to light . No scleral icterus.   HEENT: Head atraumatic, normocephalic. Oropharynx and nasopharynx clear.  NECK:  Supple, no jugular venous distention. No thyroid enlargement, no tenderness.  LUNGS: faint wheezing, bilateral basilar crepitations present. CARDIOVASCULAR: S1, S2 normal. No murmurs, rubs, or gallops.  ABDOMEN: Soft, nontender, nondistended. Bowel sounds present.PEG tube in place EXTREMITIES: No pedal edema, cyanosis, or clubbing.  NEUROLOGIC: Unable to do full neurological exam because of normal state and not following commands PSYCHIATRIC: The patient is alert.  but nonverbal. SKIN: No obvious rash, lesion, or ulcer.    LABORATORY PANEL:   CBC  Recent Labs Lab 01/07/17 0446  WBC 9.8  HGB 12.8*  HCT 38.7*  PLT 126*   ------------------------------------------------------------------------------------------------------------------  Chemistries   Recent Labs Lab 01/05/17 1644  01/08/17 0510  NA 148*  < > 144  K 3.5  < > 3.1*  CL 118*  < > 112*   CO2 21*  < > 26  GLUCOSE 256*  < > 139*  BUN 32*  < > 11  CREATININE 0.85  < > 0.56*  CALCIUM 7.5*  < > 8.1*  MG  --   < > 1.9  AST 68*  --   --   ALT 82*  --   --   ALKPHOS 55  --   --   BILITOT 0.8  --   --   < > = values in this interval not displayed. ------------------------------------------------------------------------------------------------------------------  Cardiac Enzymes  Recent Labs Lab 01/06/17 2305  TROPONINI 0.05*   ------------------------------------------------------------------------------------------------------------------  RADIOLOGY:  No results found.  EKG:   Orders placed or performed during the hospital encounter of 01/05/17  . ED EKG  . ED EKG  . EKG 12-Lead  . EKG 12-Lead  . EKG 12-Lead  . EKG 12-Lead    ASSESSMENT AND PLAN:  #46.66 year old male patient with severe sepsis with high fevers, elevated lactic acid secondary to right lower lobe pneumonia, C. difficile colitis: Continue IV Rocephin, IV Flagyl.,, By mouth vancomycin through the PEG tube. IV fluids.No further fever. WBC is normal. Staph species in the blood.likely contamination. #2. History of previous CVA: Nonverbal. Continue tube feedings,  #3 atrial fibrillation with RVR: Patient is off the Cardizem drip. Continue metoprolol, Xarelto.still has tachycardia, use Cardizem also at 60 mg every 6 hours via PEG.   #4 hypernatremia due to dehydration;improved, now has shortness of breath likely due to fluid overload. Start IV hydration. Continue oxygen, repeat chest x-ray. Initial x-ray showed  pneumonia on the right side. Hypokalemia: Replace the potassium. Marland Kitchen  CODE STATUS DO NOT RESUSCITATE.  #5. elevated troponin secondary to demand ischemia. Continue metoprolol, statins. Echo  shows EF 60-65% with normal wall motion. 6. history of seizures: Continue Keppra.  Prognosis is poor, All the records are reviewed and case discussed with Care Management/Social Workerr. Management plans  discussed with the patient, family and they are in agreement.  CODE STATUS: DO NOT RESUSCITATE  TOTAL TIME TAKING CARE OF THIS PATIENT:35 minutes.   POSSIBLE D/C IN 2-3 DAYS, DEPENDING ON CLINICAL CONDITION.   Katha Hamming M.D on 01/08/2017 at 12:25 PM  Between 7am to 6pm - Pager - [phone]  After 6pm go to [email or web address] - password [email or web address] [facility]  [place] [facility]  Office  [phone]  CC: Primary care physician; Dimple Casey, MD   Note: This dictation was prepared with Dragon dictation along with smaller phrase technology. Any transcriptional errors that result from this process are unintentional.

## 2017-01-09 ENCOUNTER — Inpatient Hospital Stay: Payer: Medicare Other

## 2017-01-09 LAB — BASIC METABOLIC PANEL
ANION GAP: 4 — AB (ref 5–15)
BUN: 11 mg/dL (ref 6–20)
CALCIUM: 8.3 mg/dL — AB (ref 8.9–10.3)
CO2: 26 mmol/L (ref 22–32)
Chloride: 109 mmol/L (ref 101–111)
Creatinine, Ser: 0.4 mg/dL — ABNORMAL LOW (ref 0.61–1.24)
Glucose, Bld: 107 mg/dL — ABNORMAL HIGH (ref 65–99)
POTASSIUM: 4.1 mmol/L (ref 3.5–5.1)
Sodium: 139 mmol/L (ref 135–145)

## 2017-01-09 LAB — CULTURE, BLOOD (ROUTINE X 2): Special Requests: ADEQUATE

## 2017-01-09 LAB — PHOSPHORUS: PHOSPHORUS: 3.9 mg/dL (ref 2.5–4.6)

## 2017-01-09 LAB — MAGNESIUM: Magnesium: 2 mg/dL (ref 1.7–2.4)

## 2017-01-09 MED ORDER — DILTIAZEM HCL 30 MG PO TABS
60.0000 mg | ORAL_TABLET | Freq: Three times a day (TID) | ORAL | Status: DC
  Administered 2017-01-09 – 2017-01-10 (×3): 60 mg via ORAL
  Filled 2017-01-09 (×3): qty 2

## 2017-01-09 NOTE — Progress Notes (Signed)
While rounding, CH made initial visit with Pt in 241. CH was referred to the Pt via the Prayer Request card. The card requested prayer. Pt was resting and CH prayed silently at the room door.      01/09/17 1200  Clinical Encounter Type  Visited With Patient  Visit Type Initial;Spiritual support  Referral From Nurse  Consult/Referral To Chaplain  Spiritual Encounters  Spiritual Needs Prayer

## 2017-01-09 NOTE — Progress Notes (Signed)
MEDICATION RELATED CONSULT NOTE - INITIAL   Pharmacy Consult for electrolyte management Indication: hypophosphatemia, hypokalemia  No Known Allergies  Patient Measurements: Height:  (167.6 cm) Weight: 143 lb 4.8 oz (65 kg) IBW/kg (Calculated) : 63.8 Adjusted Body Weight: 65 kg  Vital Signs: Temp: 99.4 F (37.4 C) (09/18 0531) Temp Source: Oral (09/18 0531) BP: 136/86 (09/18 0531) Pulse Rate: 110 (09/18 0539) Intake/Output from previous day: 09/17 0701 - 09/18 0700 In: 1857 [I.V.:610; NG/GT:1097; IV Piggyback:150] Out: 0  Intake/Output from this shift: No intake/output data recorded.  Labs:  Recent Labs  01/07/17 0446 01/08/17 0510 01/09/17 0633  WBC 9.8  --   --   HGB 12.8*  --   --   HCT 38.7*  --   --   PLT 126*  --   --   CREATININE 0.53* 0.56* 0.40*  MG 2.0 1.9 2.0  PHOS 1.7* 3.0 3.9  ALBUMIN  --  2.4*  --    Estimated Creatinine Clearance: 79.8 mL/min (A) (by C-G formula based on SCr of 0.4 mg/dL (L)).   Microbiology: Recent Results (from the past 720 hour(s))  Blood Culture (routine x 2)     Status: Abnormal   Collection Time: 01/05/17  3:50 PM  Result Value Ref Range Status   Specimen Description BLOOD BLOOD RIGHT WRIST  Final   Special Requests   Final    BOTTLES DRAWN AEROBIC AND ANAEROBIC Blood Culture adequate volume   Culture  Setup Time   Final    GRAM POSITIVE COCCI AEROBIC BOTTLE ONLY CRITICAL RESULT CALLED TO, READ BACK BY AND VERIFIED WITH: DAVID BESANTI AT 0354 01/07/17.PMH    Culture (A)  Final    STAPHYLOCOCCUS SPECIES (COAGULASE NEGATIVE) THE SIGNIFICANCE OF ISOLATING THIS ORGANISM FROM A SINGLE SET OF BLOOD CULTURES WHEN MULTIPLE SETS ARE DRAWN IS UNCERTAIN. PLEASE NOTIFY THE MICROBIOLOGY DEPARTMENT WITHIN ONE WEEK IF SPECIATION AND SENSITIVITIES ARE REQUIRED. Performed at [facility], [address] [place], [place] [address]    Report Status 01/09/2017 FINAL  Final  Blood Culture (routine x 2)     Status: None  (Preliminary result)   Collection Time: 01/05/17  3:50 PM  Result Value Ref Range Status   Specimen Description BLOOD RIGHT ANTECUBITAL  Final   Special Requests   Final    BOTTLES DRAWN AEROBIC AND ANAEROBIC Blood Culture adequate volume   Culture NO GROWTH 4 DAYS  Final   Report Status PENDING  Incomplete  Urine culture     Status: None   Collection Time: 01/05/17  3:50 PM  Result Value Ref Range Status   Specimen Description URINE, RANDOM  Final   Special Requests NONE  Final   Culture   Final    NO GROWTH Performed at [facility], [address] [place], [place] [address]    Report Status 01/06/2017 FINAL  Final  Blood Culture ID Panel (Reflexed)     Status: Abnormal   Collection Time: 01/05/17  3:50 PM  Result Value Ref Range Status   Enterococcus species NOT DETECTED NOT DETECTED Final   Listeria monocytogenes NOT DETECTED NOT DETECTED Final   Staphylococcus species DETECTED (A) NOT DETECTED Final    Comment: Methicillin (oxacillin) susceptible coagulase negative staphylococcus. Possible blood culture contaminant (unless isolated from more than one blood culture draw or clinical case suggests pathogenicity). No antibiotic treatment is indicated for blood  culture contaminants. CRITICAL RESULT CALLED TO, READ BACK BY AND VERIFIED WITH: DAVID BESANTI AT  8119 01/07/17.PMH    Staphylococcus aureus NOT DETECTED NOT DETECTED Final   Methicillin resistance NOT DETECTED NOT DETECTED Final   Streptococcus species NOT DETECTED NOT DETECTED Final   Streptococcus agalactiae NOT DETECTED NOT DETECTED Final   Streptococcus pneumoniae NOT DETECTED NOT DETECTED Final   Streptococcus pyogenes NOT DETECTED NOT DETECTED Final   Acinetobacter baumannii NOT DETECTED NOT DETECTED Final   Enterobacteriaceae species NOT DETECTED NOT DETECTED Final   Enterobacter cloacae complex NOT DETECTED NOT DETECTED Final   Escherichia coli NOT DETECTED NOT DETECTED Final   Klebsiella oxytoca NOT  DETECTED NOT DETECTED Final   Klebsiella pneumoniae NOT DETECTED NOT DETECTED Final   Proteus species NOT DETECTED NOT DETECTED Final   Serratia marcescens NOT DETECTED NOT DETECTED Final   Haemophilus influenzae NOT DETECTED NOT DETECTED Final   Neisseria meningitidis NOT DETECTED NOT DETECTED Final   Pseudomonas aeruginosa NOT DETECTED NOT DETECTED Final   Candida albicans NOT DETECTED NOT DETECTED Final   Candida glabrata NOT DETECTED NOT DETECTED Final   Candida krusei NOT DETECTED NOT DETECTED Final   Candida parapsilosis NOT DETECTED NOT DETECTED Final   Candida tropicalis NOT DETECTED NOT DETECTED Final  MRSA PCR Screening     Status: None   Collection Time: 01/05/17 11:49 PM  Result Value Ref Range Status   MRSA by PCR NEGATIVE NEGATIVE Final    Comment:        The GeneXpert MRSA Assay (FDA approved for NASAL specimens only), is one component of a comprehensive MRSA colonization surveillance program. It is not intended to diagnose MRSA infection nor to guide or monitor treatment for MRSA infections.   C difficile quick scan w PCR reflex     Status: Abnormal   Collection Time: 01/05/17 11:49 PM  Result Value Ref Range Status   C Diff antigen POSITIVE (A) NEGATIVE Final   C Diff toxin NEGATIVE NEGATIVE Final   C Diff interpretation Results are indeterminate. See PCR results.  Final  Gastrointestinal Panel by PCR , Stool     Status: None   Collection Time: 01/05/17 11:49 PM  Result Value Ref Range Status   Campylobacter species NOT DETECTED NOT DETECTED Final   Plesimonas shigelloides NOT DETECTED NOT DETECTED Final   Salmonella species NOT DETECTED NOT DETECTED Final   Yersinia enterocolitica NOT DETECTED NOT DETECTED Final   Vibrio species NOT DETECTED NOT DETECTED Final   Vibrio cholerae NOT DETECTED NOT DETECTED Final   Enteroaggregative E coli (EAEC) NOT DETECTED NOT DETECTED Final   Enteropathogenic E coli (EPEC) NOT DETECTED NOT DETECTED Final   Enterotoxigenic  E coli (ETEC) NOT DETECTED NOT DETECTED Final   Shiga like toxin producing E coli (STEC) NOT DETECTED NOT DETECTED Final   Shigella/Enteroinvasive E coli (EIEC) NOT DETECTED NOT DETECTED Final   Cryptosporidium NOT DETECTED NOT DETECTED Final   Cyclospora cayetanensis NOT DETECTED NOT DETECTED Final   Entamoeba histolytica NOT DETECTED NOT DETECTED Final   Giardia lamblia NOT DETECTED NOT DETECTED Final   Adenovirus F40/41 NOT DETECTED NOT DETECTED Final   Astrovirus NOT DETECTED NOT DETECTED Final   Norovirus GI/GII NOT DETECTED NOT DETECTED Final   Rotavirus A NOT DETECTED NOT DETECTED Final   Sapovirus (I, II, IV, and V) NOT DETECTED NOT DETECTED Final  Clostridium Difficile by PCR     Status: Abnormal   Collection Time: 01/05/17 11:49 PM  Result Value Ref Range Status   Toxigenic C Difficile by pcr POSITIVE (A) NEGATIVE Final  Comment: Positive for toxigenic C. difficile with little to no toxin production. Only treat if clinical presentation suggests symptomatic illness.    Medical History: Past Medical History:  Diagnosis Date  . Atrial fibrillation (HCC)   . Hyperlipidemia   . Hypertension   . Seizure (HCC)    ON KEPPRA  . Stroke [facility])     Medications:  Scheduled:  . atorvastatin  20 mg Oral QHS  . baclofen  10 mg Oral TID  . chlorhexidine  15 mL Mouth Rinse BID  . cholecalciferol  5,000 Units Oral Daily  . free water  200 mL Per Tube Q6H  . levETIRAcetam  500 mg Per Tube BID  . mouth rinse  15 mL Mouth Rinse q12n4p  . metoprolol tartrate  50 mg Oral BID  . multivitamin  15 mL Per Tube Daily  . nystatin  5 mL Oral QID  . pantoprazole sodium  40 mg Per Tube BID  . potassium & sodium phosphates  2 packet Per Tube TID WC & HS  . rivaroxaban  20 mg Oral Q supper  . TWOCAL HN  1,000 mL Per Tube Daily  . vancomycin  500 mg Per Tube Q6H    Assessment: Patient admitted as a code sepsis s/t RLL PNA and C diff colitis. Patient has hypokalemia and  hypophosphatemia.  Goal of Therapy:  K+ 3.5 - 5.0 Phos 2.5 - 4.5  Plan:  Patient was originally ordered Kphos 30 mmol IV -- but patient has G-tube order was changed to Kphos neutral. Will continue Phos-NaK packet:  2 packets three times daily w/ meals down G tube for 2 days. Will monitor electrolytes w/ am labs.  9/17 0510 K 3.1, Ca 8.1, albumin 2.4, adjusted Ca 9.38, Mg 1.9, Phos 2.4. Continue current order for Phos-Nak 2 packets per tube ACHS. Will recheck electrolytes tomorrow with AM labs.   9/18 0633 K 4.1, Ca 8.3, albumin 2.4, adjusted Ca 9.58, Mg 2, Phos 3.9. Current order for Phos-Nak 2 packets per tube ACHS will last for 2 more doses. Give as ordered and recheck electrolytes tomorrow with AM labs.   Justin [name] A. [place], [place].D., BCPS Clinical Pharmacist 01/09/2017

## 2017-01-09 NOTE — Progress Notes (Addendum)
[facility] [place] at [facility]   PATIENT NAME: Justin [name]    MR#:  [id number]  DATE OF BIRTH:  [birth year]  SUBJECTIVE: Admitted for fever, weakness. Found to have sepsis due to pneumonia, C. difficile colitis. No further fevers. No hypoxia also. tube feedings ordered but not given as ordered.   CHIEF COMPLAINT:   Chief Complaint  Patient presents with  . Code Sepsis    REVIEW OF SYSTEMS:   Review of Systems  Unable to perform ROS: Patient nonverbal     DRUG ALLERGIES:  No Known Allergies  VITALS:  Blood pressure 114/68, pulse (!) 121, temperature (!) 97.4 F (36.3 C), temperature source Tympanic, resp. rate 18, height  (1.676 m), weight 65 kg (143 lb 4.8 oz), SpO2 100 %.  PHYSICAL EXAMINATION:  GENERAL:  66 y.o.-year-old patient lying in the bed with no acute distress.nonverbal. Nods his head. Alert, awake.  EYES: Pupils equal, round, reactive to light . No scleral icterus.   HEENT: Head atraumatic, normocephalic. Oropharynx and nasopharynx clear.  NECK:  Supple, no jugular venous distention. No thyroid enlargement, no tenderness.  LUNGS: faint wheezing, bilateral basilar crepitations present. CARDIOVASCULAR: S1, S2 normal. No murmurs, rubs, or gallops.  ABDOMEN: Soft, nontender, nondistended. Bowel sounds present.PEG tube in place EXTREMITIES: No pedal edema, cyanosis, or clubbing.  NEUROLOGIC: Unable to do full neurological exam because of normal state and not following commands PSYCHIATRIC: The patient is alert.  but nonverbal. SKIN: No obvious rash, lesion, or ulcer.    LABORATORY PANEL:   CBC  Recent Labs Lab 01/07/17 0446  WBC 9.8  HGB 12.8*  HCT 38.7*  PLT 126*   ------------------------------------------------------------------------------------------------------------------  Chemistries   Recent Labs Lab 01/05/17 1644  01/09/17 0633  NA 148*  < > 139  K 3.5  < > 4.1  CL 118*  < > 109  CO2 21*  < > 26   GLUCOSE 256*  < > 107*  BUN 32*  < > 11  CREATININE 0.85  < > 0.40*  CALCIUM 7.5*  < > 8.3*  MG  --   < > 2.0  AST 68*  --   --   ALT 82*  --   --   ALKPHOS 55  --   --   BILITOT 0.8  --   --   < > = values in this interval not displayed. ------------------------------------------------------------------------------------------------------------------  Cardiac Enzymes  Recent Labs Lab 01/06/17 2305  TROPONINI 0.05*   ------------------------------------------------------------------------------------------------------------------  RADIOLOGY:  No results found.  EKG:   Orders placed or performed during the hospital encounter of 01/05/17  . ED EKG  . ED EKG  . EKG 12-Lead  . EKG 12-Lead  . EKG 12-Lead  . EKG 12-Lead    ASSESSMENT AND PLAN:  #70.66 year old male patient with severe sepsis with high fevers, elevated lactic acid secondary to right lower lobe pneumonia, C. difficile colitis:She is on Zosyn for pneumonia, high-dose vancomycin 500 mg every 6 hours for C. difficile colitis, plan is to monitor closely and repeat chest x-ray today and most likely he will be going skilled nursing tomorrow./  #2. History of previous CVA: Nonverbal. Continue tube feedings, restart tube feedings as ordered.  #3 atrial fibrillation with RVR: Patient is off the Cardizem drip. Continue metoprolol, Xarelto.Heart rate is in 120. days. Added the Cardizem.  #4 .hypernatremia due to dehydration;improved,   . Hypophosphatemia, hypokalemia: Resolved.  CODE STATUS DO NOT RESUSCITATE.  #5. elevated troponin secondary to demand  ischemia. Continue metoprolol, statins. Echo  shows EF 60-65% with normal wall motion. 6. history of seizures: Continue Keppra.  Prognosis is poor, possible discharge tomorrow. All the records are reviewed and case discussed with Care Management/Social Workerr. Management plans discussed with the patient, family and they are in agreement.  CODE STATUS: DO NOT  RESUSCITATE  TOTAL TIME TAKING CARE OF THIS PATIENT:35 minutes.   POSSIBLE D/C IN 2-3 DAYS, DEPENDING ON CLINICAL CONDITION.   Katha Hamming M.D on 01/09/2017 at 12:06 PM  Between 7am to 6pm - Pager - [phone]  After 6pm go to [email or web address] - password [email or web address] [facility]  [facility]  Office  [phone]  CC: Primary care physician; Dimple Casey, MD   Note: This dictation was prepared with Dragon dictation along with smaller phrase technology. Any transcriptional errors that result from this process are unintentional.

## 2017-01-10 LAB — BASIC METABOLIC PANEL
Anion gap: 6 (ref 5–15)
BUN: 13 mg/dL (ref 6–20)
CALCIUM: 8.2 mg/dL — AB (ref 8.9–10.3)
CO2: 27 mmol/L (ref 22–32)
CREATININE: 0.51 mg/dL — AB (ref 0.61–1.24)
Chloride: 104 mmol/L (ref 101–111)
Glucose, Bld: 136 mg/dL — ABNORMAL HIGH (ref 65–99)
Potassium: 4 mmol/L (ref 3.5–5.1)
SODIUM: 137 mmol/L (ref 135–145)

## 2017-01-10 LAB — CULTURE, BLOOD (ROUTINE X 2)
CULTURE: NO GROWTH
SPECIAL REQUESTS: ADEQUATE

## 2017-01-10 LAB — PHOSPHORUS: PHOSPHORUS: 2.7 mg/dL (ref 2.5–4.6)

## 2017-01-10 LAB — MAGNESIUM: MAGNESIUM: 2 mg/dL (ref 1.7–2.4)

## 2017-01-10 MED ORDER — VANCOMYCIN 50 MG/ML ORAL SOLUTION: 500.0000 mg | Freq: Four times a day (QID) | ORAL | 0 refills | Status: DC

## 2017-01-10 MED ORDER — DILTIAZEM HCL 60 MG PO TABS: 60.0000 mg | ORAL_TABLET | Freq: Three times a day (TID) | ORAL | 0 refills | Status: AC

## 2017-01-10 MED ORDER — AMOXICILLIN-POT CLAVULANATE 875-125 MG PO TABS: 1.0000 | ORAL_TABLET | Freq: Two times a day (BID) | ORAL | 0 refills | Status: AC

## 2017-01-10 MED ORDER — METOPROLOL TARTRATE 50 MG PO TABS: 50.0000 mg | ORAL_TABLET | Freq: Two times a day (BID) | ORAL | 0 refills | Status: DC

## 2017-01-10 NOTE — Progress Notes (Signed)
MEDICATION RELATED CONSULT NOTE - INITIAL   Pharmacy Consult for electrolyte management Indication: hypophosphatemia, hypokalemia  No Known Allergies  Patient Measurements: Height:  (167.6 cm) Weight: 143 lb 4.8 oz (65 kg) IBW/kg (Calculated) : 63.8 Adjusted Body Weight: 65 kg  Vital Signs: Temp: 97.8 F (36.6 C) (09/19 0601) Temp Source: Rectal (09/18 2100) BP: 125/83 (09/19 0601) Pulse Rate: 97 (09/19 0601) Intake/Output from previous day: 09/18 0701 - 09/19 0700 In: 1850 [NG/GT:1700; IV Piggyback:150] Out: -  Intake/Output from this shift: No intake/output data recorded.  Labs:  Recent Labs  01/08/17 0510 01/09/17 0633 01/10/17 0635  CREATININE 0.56* 0.40* 0.51*  MG 1.9 2.0 2.0  PHOS 3.0 3.9 2.7  ALBUMIN 2.4*  --   --    Estimated Creatinine Clearance: 79.8 mL/min (A) (by C-G formula based on SCr of 0.51 mg/dL (L)).   Microbiology: Recent Results (from the past 720 hour(s))  Blood Culture (routine x 2)     Status: Abnormal   Collection Time: 01/05/17  3:50 PM  Result Value Ref Range Status   Specimen Description BLOOD BLOOD RIGHT WRIST  Final   Special Requests   Final    BOTTLES DRAWN AEROBIC AND ANAEROBIC Blood Culture adequate volume   Culture  Setup Time   Final    GRAM POSITIVE COCCI AEROBIC BOTTLE ONLY CRITICAL RESULT CALLED TO, READ BACK BY AND VERIFIED WITH: DAVID BESANTI AT 0354 01/07/17.PMH    Culture (A)  Final    STAPHYLOCOCCUS SPECIES (COAGULASE NEGATIVE) THE SIGNIFICANCE OF ISOLATING THIS ORGANISM FROM A SINGLE SET OF BLOOD CULTURES WHEN MULTIPLE SETS ARE DRAWN IS UNCERTAIN. PLEASE NOTIFY THE MICROBIOLOGY DEPARTMENT WITHIN ONE WEEK IF SPECIATION AND SENSITIVITIES ARE REQUIRED. Performed at [facility], [address], [place], [place] [address]    Report Status 01/09/2017 FINAL  Final  Blood Culture (routine x 2)     Status: None (Preliminary result)   Collection Time: 01/05/17  3:50 PM  Result Value Ref Range Status   Specimen Description BLOOD RIGHT ANTECUBITAL  Final   Special Requests   Final    BOTTLES DRAWN AEROBIC AND ANAEROBIC Blood Culture adequate volume   Culture NO GROWTH 4 DAYS  Final   Report Status PENDING  Incomplete  Urine culture     Status: None   Collection Time: 01/05/17  3:50 PM  Result Value Ref Range Status   Specimen Description URINE, RANDOM  Final   Special Requests NONE  Final   Culture   Final    NO GROWTH Performed at [facility], [address], [place], [place] [address]    Report Status 01/06/2017 FINAL  Final  Blood Culture ID Panel (Reflexed)     Status: Abnormal   Collection Time: 01/05/17  3:50 PM  Result Value Ref Range Status   Enterococcus species NOT DETECTED NOT DETECTED Final   Listeria monocytogenes NOT DETECTED NOT DETECTED Final   Staphylococcus species DETECTED (A) NOT DETECTED Final    Comment: Methicillin (oxacillin) susceptible coagulase negative staphylococcus. Possible blood culture contaminant (unless isolated from more than one blood culture draw or clinical case suggests pathogenicity). No antibiotic treatment is indicated for blood  culture contaminants. CRITICAL RESULT CALLED TO, READ BACK BY AND VERIFIED WITH: DAVID BESANTI AT 0354 01/07/17.PMH    Staphylococcus aureus NOT DETECTED NOT DETECTED Final   Methicillin resistance NOT DETECTED NOT DETECTED Final   Streptococcus species NOT DETECTED NOT DETECTED Final   Streptococcus agalactiae NOT DETECTED NOT DETECTED  Final   Streptococcus pneumoniae NOT DETECTED NOT DETECTED Final   Streptococcus pyogenes NOT DETECTED NOT DETECTED Final   Acinetobacter baumannii NOT DETECTED NOT DETECTED Final   Enterobacteriaceae species NOT DETECTED NOT DETECTED Final   Enterobacter cloacae complex NOT DETECTED NOT DETECTED Final   Escherichia coli NOT DETECTED NOT DETECTED Final   Klebsiella oxytoca NOT DETECTED NOT DETECTED Final   Klebsiella pneumoniae NOT DETECTED NOT DETECTED Final   Proteus  species NOT DETECTED NOT DETECTED Final   Serratia marcescens NOT DETECTED NOT DETECTED Final   Haemophilus influenzae NOT DETECTED NOT DETECTED Final   Neisseria meningitidis NOT DETECTED NOT DETECTED Final   Pseudomonas aeruginosa NOT DETECTED NOT DETECTED Final   Candida albicans NOT DETECTED NOT DETECTED Final   Candida glabrata NOT DETECTED NOT DETECTED Final   Candida krusei NOT DETECTED NOT DETECTED Final   Candida parapsilosis NOT DETECTED NOT DETECTED Final   Candida tropicalis NOT DETECTED NOT DETECTED Final  MRSA PCR Screening     Status: None   Collection Time: 01/05/17 11:49 PM  Result Value Ref Range Status   MRSA by PCR NEGATIVE NEGATIVE Final    Comment:        The GeneXpert MRSA Assay (FDA approved for NASAL specimens only), is one component of a comprehensive MRSA colonization surveillance program. It is not intended to diagnose MRSA infection nor to guide or monitor treatment for MRSA infections.   C difficile quick scan w PCR reflex     Status: Abnormal   Collection Time: 01/05/17 11:49 PM  Result Value Ref Range Status   C Diff antigen POSITIVE (A) NEGATIVE Final   C Diff toxin NEGATIVE NEGATIVE Final   C Diff interpretation Results are indeterminate. See PCR results.  Final  Gastrointestinal Panel by PCR , Stool     Status: None   Collection Time: 01/05/17 11:49 PM  Result Value Ref Range Status   Campylobacter species NOT DETECTED NOT DETECTED Final   Plesimonas shigelloides NOT DETECTED NOT DETECTED Final   Salmonella species NOT DETECTED NOT DETECTED Final   Yersinia enterocolitica NOT DETECTED NOT DETECTED Final   Vibrio species NOT DETECTED NOT DETECTED Final   Vibrio cholerae NOT DETECTED NOT DETECTED Final   Enteroaggregative E coli (EAEC) NOT DETECTED NOT DETECTED Final   Enteropathogenic E coli (EPEC) NOT DETECTED NOT DETECTED Final   Enterotoxigenic E coli (ETEC) NOT DETECTED NOT DETECTED Final   Shiga like toxin producing E coli (STEC) NOT  DETECTED NOT DETECTED Final   Shigella/Enteroinvasive E coli (EIEC) NOT DETECTED NOT DETECTED Final   Cryptosporidium NOT DETECTED NOT DETECTED Final   Cyclospora cayetanensis NOT DETECTED NOT DETECTED Final   Entamoeba histolytica NOT DETECTED NOT DETECTED Final   Giardia lamblia NOT DETECTED NOT DETECTED Final   Adenovirus F40/41 NOT DETECTED NOT DETECTED Final   Astrovirus NOT DETECTED NOT DETECTED Final   Norovirus GI/GII NOT DETECTED NOT DETECTED Final   Rotavirus A NOT DETECTED NOT DETECTED Final   Sapovirus (I, II, IV, and V) NOT DETECTED NOT DETECTED Final  Clostridium Difficile by PCR     Status: Abnormal   Collection Time: 01/05/17 11:49 PM  Result Value Ref Range Status   Toxigenic C Difficile by pcr POSITIVE (A) NEGATIVE Final    Comment: Positive for toxigenic C. difficile with little to no toxin production. Only treat if clinical presentation suggests symptomatic illness.    Medical History: Past Medical History:  Diagnosis Date  . Atrial fibrillation (HCC)   .  Hyperlipidemia   . Hypertension   . Seizure (HCC)    ON KEPPRA  . Stroke [facility])     Medications:  Scheduled:  . atorvastatin  20 mg Oral QHS  . baclofen  10 mg Oral TID  . chlorhexidine  15 mL Mouth Rinse BID  . cholecalciferol  5,000 Units Oral Daily  . diltiazem  60 mg Oral Q8H  . free water  200 mL Per Tube Q6H  . levETIRAcetam  500 mg Per Tube BID  . mouth rinse  15 mL Mouth Rinse q12n4p  . metoprolol tartrate  50 mg Oral BID  . multivitamin  15 mL Per Tube Daily  . nystatin  5 mL Oral QID  . pantoprazole sodium  40 mg Per Tube BID  . rivaroxaban  20 mg Oral Q supper  . TWOCAL HN  1,000 mL Per Tube Daily  . vancomycin  500 mg Per Tube Q6H    Assessment: Patient admitted as a code sepsis s/t RLL PNA and C diff colitis. Patient has hypokalemia and hypophosphatemia.  Goal of Therapy:  K+ 3.5 - 5.0 Phos 2.5 - 4.5  Plan:  Patient was originally ordered Kphos 30 mmol IV -- but patient has  G-tube order was changed to Kphos neutral. Will continue Phos-NaK packet:  2 packets three times daily w/ meals down G tube for 2 days. Will monitor electrolytes w/ am labs.  9/17 0510 K 3.1, Ca 8.1, albumin 2.4, adjusted Ca 9.38, Mg 1.9, Phos 2.4. Continue current order for Phos-Nak 2 packets per tube ACHS. Will recheck electrolytes tomorrow with AM labs.   9/18 0633 K 4.1, Ca 8.3, albumin 2.4, adjusted Ca 9.58, Mg 2, Phos 3.9. Current order for Phos-Nak 2 packets per tube ACHS will last for 2 more doses. Give as ordered and recheck electrolytes tomorrow with AM labs.   9/19 0635 K 4, Ca 8.2, albumin 2.4, adjusted Ca 9.48, Mg 2, Phos 2.7. No indication for further supplement at this time. Will recheck with AM labs in 2 days.  Lindsy Cerullo A. [place], [place].D., BCPS Clinical Pharmacist 01/10/2017

## 2017-01-10 NOTE — Discharge Summary (Signed)
Justin [name], is a 66 y.o. male  DOB 11/01/1950  MRN [id number].  Admission date:  01/05/2017  Admitting Physician  [name] Saran, MD  Discharge Date:  01/10/2017   Primary MD  Dimple Casey, MD  Recommendations for primary care physician for things to follow:  Follow-up with PCP in one week   Admission Diagnosis  Healthcare-associated pneumonia [J18.9] Sepsis, due to unspecified organism [facility]) [A41.9]   Discharge Diagnosis  Healthcare-associated pneumonia [J18.9] Sepsis, due to unspecified organism [facility]) [A41.9]    Active Problems:   Sepsis [facility])      Past Medical History:  Diagnosis Date  . Atrial fibrillation (HCC)   . Hyperlipidemia   . Hypertension   . Seizure (HCC)    ON KEPPRA  . Stroke [facility])     History reviewed. No pertinent surgical history.     History of present illness and  Hospital Course:     Kindly see H&P for history of present illness and admission details, please review complete Labs, Consult reports and Test reports for all details in brief  HPI  from the history and physical done on the day of admission 66 year old male patient admitted for severe sepsis with fever, altered mental status., Tachycardia with heart rate 122 beats, temperature 103 Fahrenheit. Patient has history of previous CVA with residual left-sided weakness, dysarthria, dysphagia, PEG tube feedings. He also has history of seizure disorder, chronic atrial fibrillation.   Hospital Course  #1 severe sepsis secondary to healthcare associated pneumonia, C. difficile colitis. Lactic acid on admission 4.4 and then improved with treatment to 2.5. Admitted to intensive care unit, started on IV Zosyn, patient thought to have aspiration pneumonia, x-ray concerning for right lower lobe pneumonia. Patient with aggressive hydration, he  also received by mouth vancomycin through PEG for C. difficile colitis. Patient has no further fever, WBC normalized to 9.8. Stable for discharge back [facility] today with Augmentin for 10 days, vancomycin 500 mg that is 10 mL every 6 hours for 12 days. #2. Colitis: No further diarrhea. Continue vancomycin 500 mg every 6 hours after the holidays. #3 history of previous CVA, dysphagia, nonverbal at baseline. Tolerating the tube feedings. #4 hyponatremia secondary to dehydration and sepsis: Sodium 154 and then decreased with D5 containing IV fluids and sodium decreased to 137 today. #Atrial fibrillation with RVR: Received a Cardizem drip, weaned off Cardizem drip, continue metoprolol, Xarelto, added Cardizem also in addition to metoprolol to control the heart rate. #6. Elevated troponin secondary to demand ischemia. Echocardiogram showed EF 60-65% with normal bowel motion. #7 history of seizures: Continue Keppra.  Discharge Condition: Stable.   Follow UP  Contact information for after-discharge care    Destination    [facility] .   Specialty:  Skilled Nursing Facility Contact information: [address] [place] [address] [phone]                Discharge Instructions  and  Discharge Medications      Allergies as of 01/10/2017   No Known Allergies     Medication List    STOP taking these medications   amoxicillin-clavulanate 400-57 MG/5ML suspension Commonly known as:  AUGMENTIN Replaced by:  amoxicillin-clavulanate 875-125 MG tablet   apixaban 5 MG Tabs tablet Commonly known as:  ELIQUIS   cefTRIAXone 1-3.74 GM-% IVPB Commonly known as:  ROCEPHIN   metroNIDAZOLE 500 MG tablet Commonly known as:  FLAGYL     TAKE these medications   acetaminophen  325 MG tablet Commonly known as:  TYLENOL 650 mg by Gastric Tube route every 4 (four) hours as needed for mild pain or moderate pain.   amoxicillin-clavulanate 875-125 MG  tablet Commonly known as:  AUGMENTIN Take 1 tablet by mouth 2 (two) times daily. Replaces:  amoxicillin-clavulanate 400-57 MG/5ML suspension   atorvastatin 20 MG tablet Commonly known as:  LIPITOR Take 20 mg by mouth at bedtime.   baclofen 10 MG tablet Commonly known as:  LIORESAL Take 10 mg by mouth 3 (three) times daily.   BIOFREEZE 4 % Gel Generic drug:  Menthol (Topical Analgesic) Apply 1 application topically 2 (two) times daily. Apply to left shoulder and elbow for arthritis.   diltiazem 60 MG tablet Commonly known as:  CARDIZEM Take 1 tablet (60 mg total) by mouth every 8 (eight) hours.   feeding supplement (JEVITY 1.2 CAL) Liqd Place 1,000 mLs into feeding tube continuous. 75ml per hour   free water Soln Place 100 mLs into feeding tube every 8 (eight) hours.   ipratropium-albuterol 0.5-2.5 (3) MG/3ML Soln Commonly known as:  DUONEB Take 3 mLs by nebulization every 6 (six) hours as needed.   levETIRAcetam 100 MG/ML solution Commonly known as:  KEPPRA Place 500 mg into feeding tube 2 (two) times daily.   LORazepam 2 MG/ML concentrated solution Commonly known as:  ATIVAN Take 1 mg by mouth every 6 (six) hours as needed for seizure.   metoprolol tartrate 50 MG tablet Commonly known as:  LOPRESSOR Take 1 tablet (50 mg total) by mouth 2 (two) times daily. What changed:  medication strength  how much to take   multivitamin with minerals tablet Take 1 tablet by mouth daily.   nystatin 100000 UNIT/ML suspension Commonly known as:  MYCOSTATIN Take 5 mLs (500,000 Units total) by mouth 4 (four) times daily.   omeprazole 2 mg/mL Susp Commonly known as:  PRILOSEC Take 40 mg by mouth every 12 (twelve) hours.   rivaroxaban 20 MG Tabs tablet Commonly known as:  XARELTO Take 20 mg by mouth daily with supper.   traMADol 50 MG tablet Commonly known as:  ULTRAM Take 1 tablet (50 mg total) by mouth every 12 (twelve) hours as needed for moderate pain.   vancomycin  50 mg/mL oral solution Commonly known as:  VANCOCIN Place 10 mLs (500 mg total) into feeding tube every 6 (six) hours.   Vitamin D3 5000 units Tabs 5,000 Units by Gastric Tube route daily.            Discharge Care Instructions        Start     Ordered   01/10/17 0000  diltiazem (CARDIZEM) 60 MG tablet  Every 8 hours     01/10/17 1058   01/10/17 0000  metoprolol tartrate (LOPRESSOR) 50 MG tablet  2 times daily     01/10/17 1058   01/10/17 0000  vancomycin (VANCOCIN) 50 mg/mL oral solution  Every 6 hours     01/10/17 1058   01/10/17 0000  amoxicillin-clavulanate (AUGMENTIN) 875-125 MG tablet  2 times daily     01/10/17 1058        Diet and Activity recommendation: See Discharge Instructions above   Consults obtained - critical care   Major procedures and Radiology Reports - PLEASE review detailed and final reports for all details, in brief -      Dg Chest 1 View  Result Date: 01/09/2017 CLINICAL DATA:  Shortness of breath EXAM: CHEST 1 VIEW COMPARISON:  CT from 4  days ago FINDINGS: Right lower lobe atelectasis and pneumonia on recent CT, with hazy opacity that is increased. Prominent heart size accentuated by low volumes. Mediastinal contours are distorted by mild rotation. Low rightward tracheal deviation attributed to vessels in rotation. IMPRESSION: 1. Increased right base opacity where there is atelectasis and pneumonia on recent chest CT. 2. Right hilar and mediastinal adenopathy by CT. Recommend chest CT follow-up after convalescence. Electronically Signed   By: Marnee Spring M.D.   On: 01/09/2017 14:18   Ct Head Wo Contrast  Result Date: 01/05/2017 CLINICAL DATA:  Worsening of mental status. Fever. Tachycardia. Rapid breathing. EXAM: CT HEAD WITHOUT CONTRAST TECHNIQUE: Contiguous axial images were obtained from the base of the skull through the vertex without intravenous contrast. COMPARISON:  09/25/2015 FINDINGS: Brain: Generalized brain atrophy. Extensive old  ischemic changes. Old right cerebellar infarction. Old right frontal and insular infarction. Old right frontoparietal vertex infarction. Old left frontal and insular infarction. Old left temporal infarction. Old left occipital infarction. Old left parietal and posterior frontal infarction. No evidence of hemorrhage, hydrocephalus or extra-axial collection. Vascular: No abnormal vascular finding. Skull: Negative Sinuses/Orbits: Sinuses are clear. Old right medial orbital blowout fracture. No acute orbital finding. Other: None IMPRESSION: No acute finding by CT extensive old brain infarctions scattered throughout as outlined above. Electronically Signed   By: Paulina Fusi M.D.   On: 01/05/2017 18:45   Ct Chest W Contrast  Result Date: 01/05/2017 CLINICAL DATA:  Worsening mental status.  Fever.  Rapid breathing. EXAM: CT CHEST WITH CONTRAST TECHNIQUE: Multidetector CT imaging of the chest was performed during intravenous contrast administration. CONTRAST:  ISOVUE-300 IOPAMIDOL (ISOVUE-300) INJECTION 61% COMPARISON:  Chest radiography same day FINDINGS: Cardiovascular: Aortic atherosclerosis. No aneurysm or dissection. No visible pulmonary emboli. The heart is not enlarged. No coronary artery calcification is seen. Mediastinum/Nodes: Prominence of the right hilar and paratracheal nodes, felt most likely reactive. See below discussion. Lungs/Pleura: Left lung is clear. There is right lower lobe collapse and pneumonia. Right hilar and paratracheal nodes are fell reactive to the right lower lobe pneumonia. Cannot rule out the possibility of underlying tumor with metastasis. Consider repeat examination after treatment if appropriate. Upper Abdomen: See results of abdominal study. Musculoskeletal: Negative IMPRESSION: Right lower lobe pneumonia and collapse. Enlargement of the right hilar and paratracheal nodes, most likely reactive to the right lower lobe pneumonia. I would suggest follow-up after treatment, if  clinically appropriate, as underlying malignancy can't be excluded. Electronically Signed   By: Paulina Fusi M.D.   On: 01/05/2017 18:49   Ct Abdomen Pelvis W Contrast  Result Date: 01/05/2017 CLINICAL DATA:  Fever.  Rapid breathing.  Worsening mental status. EXAM: CT ABDOMEN AND PELVIS WITH CONTRAST TECHNIQUE: Multidetector CT imaging of the abdomen and pelvis was performed using the standard protocol following bolus administration of intravenous contrast. CONTRAST:  ISOVUE-300 IOPAMIDOL (ISOVUE-300) INJECTION 61% COMPARISON:  09/26/2015 FINDINGS: Lower chest: See results of chest CT.  Right lower lobe pneumonia. Hepatobiliary: Negative Pancreas: Normal Spleen: Normal Adrenals/Urinary Tract: Adrenal glands are normal. Kidneys are normal. No bladder abnormality seen. Catheter within the bladder. Stomach/Bowel: Gastrostomy tube in place. No evidence of ileus or obstruction. No focal bowel lesion. Vascular/Lymphatic: Aortic atherosclerosis. No aneurysm. IVC is normal. No retroperitoneal adenopathy. Reproductive: Normal Other: No free fluid or air. Musculoskeletal: Negative.  Ordinary lumbar degenerative changes. IMPRESSION: No acute abdominal finding. Gastrostomy in place. Foley catheter in place. Electronically Signed   By: Paulina Fusi M.D.   On: 01/05/2017  18:52   Dg Chest Portable 1 View  Result Date: 01/05/2017 CLINICAL DATA:  Altered mental status and fever. EXAM: PORTABLE CHEST 1 VIEW COMPARISON:  09/25/2015 FINDINGS: Mild cardiomegaly. Chronic aortic atherosclerosis. No evidence heart failure or effusion. The left lung is clear. Question medial right lower lobe pneumonia, based on asymmetric retrocardiac density. Consider two-view radiography. IMPRESSION: Suspicion of medial right lower lobe pneumonia. Cardiomegaly and aortic atherosclerosis. Electronically Signed   By: Paulina Fusi M.D.   On: 01/05/2017 17:57    Micro Results     Recent Results (from the past 240 hour(s))  Blood Culture  (routine x 2)     Status: Abnormal   Collection Time: 01/05/17  3:50 PM  Result Value Ref Range Status   Specimen Description BLOOD BLOOD RIGHT WRIST  Final   Special Requests   Final    BOTTLES DRAWN AEROBIC AND ANAEROBIC Blood Culture adequate volume   Culture  Setup Time   Final    GRAM POSITIVE COCCI AEROBIC BOTTLE ONLY CRITICAL RESULT CALLED TO, READ BACK BY AND VERIFIED WITH: DAVID BESANTI AT 0354 01/07/17.PMH    Culture (A)  Final    STAPHYLOCOCCUS SPECIES (COAGULASE NEGATIVE) THE SIGNIFICANCE OF ISOLATING THIS ORGANISM FROM A SINGLE SET OF BLOOD CULTURES WHEN MULTIPLE SETS ARE DRAWN IS UNCERTAIN. PLEASE NOTIFY THE MICROBIOLOGY DEPARTMENT WITHIN ONE WEEK IF SPECIATION AND SENSITIVITIES ARE REQUIRED. Performed at [facility], [address], [place], [place] [address]    Report Status 01/09/2017 FINAL  Final  Blood Culture (routine x 2)     Status: None   Collection Time: 01/05/17  3:50 PM  Result Value Ref Range Status   Specimen Description BLOOD RIGHT ANTECUBITAL  Final   Special Requests   Final    BOTTLES DRAWN AEROBIC AND ANAEROBIC Blood Culture adequate volume   Culture NO GROWTH 5 DAYS  Final   Report Status 01/10/2017 FINAL  Final  Urine culture     Status: None   Collection Time: 01/05/17  3:50 PM  Result Value Ref Range Status   Specimen Description URINE, RANDOM  Final   Special Requests NONE  Final   Culture   Final    NO GROWTH Performed at [facility], [address], [place], [place] [address]    Report Status 01/06/2017 FINAL  Final  Blood Culture ID Panel (Reflexed)     Status: Abnormal   Collection Time: 01/05/17  3:50 PM  Result Value Ref Range Status   Enterococcus species NOT DETECTED NOT DETECTED Final   Listeria monocytogenes NOT DETECTED NOT DETECTED Final   Staphylococcus species DETECTED (A) NOT DETECTED Final    Comment: Methicillin (oxacillin) susceptible coagulase negative staphylococcus. Possible blood culture contaminant  (unless isolated from more than one blood culture draw or clinical case suggests pathogenicity). No antibiotic treatment is indicated for blood  culture contaminants. CRITICAL RESULT CALLED TO, READ BACK BY AND VERIFIED WITH: DAVID BESANTI AT 0354 01/07/17.PMH    Staphylococcus aureus NOT DETECTED NOT DETECTED Final   Methicillin resistance NOT DETECTED NOT DETECTED Final   Streptococcus species NOT DETECTED NOT DETECTED Final   Streptococcus agalactiae NOT DETECTED NOT DETECTED Final   Streptococcus pneumoniae NOT DETECTED NOT DETECTED Final   Streptococcus pyogenes NOT DETECTED NOT DETECTED Final   Acinetobacter baumannii NOT DETECTED NOT DETECTED Final   Enterobacteriaceae species NOT DETECTED NOT DETECTED Final   Enterobacter cloacae complex NOT DETECTED NOT DETECTED Final   Escherichia coli NOT DETECTED NOT DETECTED  Final   Klebsiella oxytoca NOT DETECTED NOT DETECTED Final   Klebsiella pneumoniae NOT DETECTED NOT DETECTED Final   Proteus species NOT DETECTED NOT DETECTED Final   Serratia marcescens NOT DETECTED NOT DETECTED Final   Haemophilus influenzae NOT DETECTED NOT DETECTED Final   Neisseria meningitidis NOT DETECTED NOT DETECTED Final   Pseudomonas aeruginosa NOT DETECTED NOT DETECTED Final   Candida albicans NOT DETECTED NOT DETECTED Final   Candida glabrata NOT DETECTED NOT DETECTED Final   Candida krusei NOT DETECTED NOT DETECTED Final   Candida parapsilosis NOT DETECTED NOT DETECTED Final   Candida tropicalis NOT DETECTED NOT DETECTED Final  MRSA PCR Screening     Status: None   Collection Time: 01/05/17 11:49 PM  Result Value Ref Range Status   MRSA by PCR NEGATIVE NEGATIVE Final    Comment:        The GeneXpert MRSA Assay (FDA approved for NASAL specimens only), is one component of a comprehensive MRSA colonization surveillance program. It is not intended to diagnose MRSA infection nor to guide or monitor treatment for MRSA infections.   C difficile quick  scan w PCR reflex     Status: Abnormal   Collection Time: 01/05/17 11:49 PM  Result Value Ref Range Status   C Diff antigen POSITIVE (A) NEGATIVE Final   C Diff toxin NEGATIVE NEGATIVE Final   C Diff interpretation Results are indeterminate. See PCR results.  Final  Gastrointestinal Panel by PCR , Stool     Status: None   Collection Time: 01/05/17 11:49 PM  Result Value Ref Range Status   Campylobacter species NOT DETECTED NOT DETECTED Final   Plesimonas shigelloides NOT DETECTED NOT DETECTED Final   Salmonella species NOT DETECTED NOT DETECTED Final   Yersinia enterocolitica NOT DETECTED NOT DETECTED Final   Vibrio species NOT DETECTED NOT DETECTED Final   Vibrio cholerae NOT DETECTED NOT DETECTED Final   Enteroaggregative E coli (EAEC) NOT DETECTED NOT DETECTED Final   Enteropathogenic E coli (EPEC) NOT DETECTED NOT DETECTED Final   Enterotoxigenic E coli (ETEC) NOT DETECTED NOT DETECTED Final   Shiga like toxin producing E coli (STEC) NOT DETECTED NOT DETECTED Final   Shigella/Enteroinvasive E coli (EIEC) NOT DETECTED NOT DETECTED Final   Cryptosporidium NOT DETECTED NOT DETECTED Final   Cyclospora cayetanensis NOT DETECTED NOT DETECTED Final   Entamoeba histolytica NOT DETECTED NOT DETECTED Final   Giardia lamblia NOT DETECTED NOT DETECTED Final   Adenovirus F40/41 NOT DETECTED NOT DETECTED Final   Astrovirus NOT DETECTED NOT DETECTED Final   Norovirus GI/GII NOT DETECTED NOT DETECTED Final   Rotavirus A NOT DETECTED NOT DETECTED Final   Sapovirus (I, II, IV, and V) NOT DETECTED NOT DETECTED Final  Clostridium Difficile by PCR     Status: Abnormal   Collection Time: 01/05/17 11:49 PM  Result Value Ref Range Status   Toxigenic C Difficile by pcr POSITIVE (A) NEGATIVE Final    Comment: Positive for toxigenic C. difficile with little to no toxin production. Only treat if clinical presentation suggests symptomatic illness.       Today   Subjective:   Justin [name] today Is  stable for discharge back to [facility].  Objective:   Blood pressure (!) 131/99, pulse 82, temperature 98.8 F (37.1 C), temperature source Axillary, resp. rate 18, height 5\' 6"  (1.676 m), weight 65 kg (143 lb 4.8 oz), SpO2 99 %.   Intake/Output Summary (Last 24 hours) at 01/10/17 1058 Last data filed at  01/10/17 0130  Gross per 24 hour  Intake             1850 ml  Output                0 ml  Net             1850 ml    Exam Awake Alert, ,Nonverbal  .AT,PERRAL Supple Neck,No JVD, No cervical lymphadenopathy appriciated.  Symmetrical Chest wall movement, Good air movement bilaterally, CTAB RRR,No Gallops,Rubs or new Murmurs, No Parasternal Heave +ve B.Sounds, Abd Soft, Non tender, No organomegaly appriciated, No rebound -guarding or rigidity. PEG tube in place, no evidence of infection. Bilateral leg contractures noted.  Data Review   CBC w Diff: Lab Results  Component Value Date   WBC 9.8 01/07/2017   HGB 12.8 (L) 01/07/2017   HCT 38.7 (L) 01/07/2017   PLT 126 (L) 01/07/2017   LYMPHOPCT 7 01/05/2017   MONOPCT 8 01/05/2017   EOSPCT 0 01/05/2017   BASOPCT 0 01/05/2017    CMP: Lab Results  Component Value Date   NA 137 01/10/2017   K 4.0 01/10/2017   CL 104 01/10/2017   CO2 27 01/10/2017   BUN 13 01/10/2017   CREATININE 0.51 (L) 01/10/2017   PROT 6.4 (L) 01/05/2017   ALBUMIN 2.4 (L) 01/08/2017   BILITOT 0.8 01/05/2017   ALKPHOS 55 01/05/2017   AST 68 (H) 01/05/2017   ALT 82 (H) 01/05/2017  .   Total Time in preparing paper work, data evaluation and todays exam - 35 minutes  Hiroyuki Ozanich M.D on 01/10/2017 at 10:58 AM    Note: This dictation was prepared with Dragon dictation along with smaller phrase technology. Any transcriptional errors that result from this process are unintentional.

## 2017-01-10 NOTE — Progress Notes (Signed)
[employer] operational officer to Angela RN at [employer]. Sister bernice was made aware of transport. EMS called. Tube feeding stopped. IV and tele removed. No further orders at this time.

## 2017-01-10 NOTE — Clinical Social Work Note (Addendum)
Patient to be d/c'ed today to [facility].  Patient and family agreeable to plans will transport via ems RN to call report to [phone].  CSW updated patient's sister Tilden Fossa, [phone].   Windell Moulding, MSW, Theresia Majors [phone]

## 2017-01-10 NOTE — Care Management Important Message (Signed)
Important Message  Patient Details  Name: Justin [name] MRN: [id number] Date of Birth: October 03, 1950   Medicare Important Message Given:  Yes  Signed IM notice given  Eber Hong, RN 01/10/2017, 11:18 AM

## 2017-01-10 NOTE — Progress Notes (Signed)
EMS arrived. Pt on way to [employer].

## 2018-04-01 ENCOUNTER — Encounter: Payer: Self-pay | Admitting: Nurse Practitioner

## 2018-04-01 ENCOUNTER — Non-Acute Institutional Stay: Payer: Medicare Other | Admitting: Nurse Practitioner

## 2018-04-01 VITALS — BP 106/74 | HR 86 | Temp 97.8°F | Resp 18 | Wt 168.3 lb

## 2018-04-01 DIAGNOSIS — R413 Other amnesia: Secondary | ICD-10-CM | POA: Insufficient documentation

## 2018-04-01 DIAGNOSIS — Z515 Encounter for palliative care: Secondary | ICD-10-CM | POA: Insufficient documentation

## 2018-04-01 DIAGNOSIS — R531 Weakness: Secondary | ICD-10-CM

## 2018-04-01 NOTE — Progress Notes (Signed)
[facility] Telephone: [phone] Fax: [phone]  PATIENT NAME: Justin [name] DOB: [birth year] MRN: [id number]  PRIMARY CARE PROVIDER:   Dimple Casey, MD  REFERRING PROVIDER:  Dimple Casey, MD [address] [place], [place] [address]  RESPONSIBLE PARTY:    Florene Route sister [phone]  ASSESSMENT:    I visited and observed Justin [name] did make eye contact to verbal cues and not his head at times when asked questions about symptoms of pain and shortness of breath. [name] was cooperative with assessment. No meaningful discussion due to cognitive impairment. [name] has had a weight gain in the setting of tube feeding essential to sustain life. [name] does appear stable at present time. No new changes to goals our current plan of care as remains a DNR. I have attempted to contact Merdis Delay his sister [employer] private attorney for update. Updated nursing staff.    11 / 27 / 2019 WBC 13.8, hemoglobin 16.6, hematocrit 51.8, platelets 249, sodium 133, potassium 4.0, chloride 105, calcium 10.8, bun 12.3, creatinine 0.82, glucose 131, albumin 4.5, total protein 9.1  12 / 9 / 2019 WBC 9.6, hemoglobin 15.3, hematocrit 45.6, platelets 219  RECOMMENDATIONS and PLAN:  1.Generalized weakness R53.1  secondary to late onset CVA. Continue to encourage to get out of bed as able, restorative exercises.   2. Memory loss R41.3 secondary to late onset CVA. Continue with supportive measures as chronic disease remains Progressive.  3. Palliative care encounter Z51.5; Ongoing monitoring chronic disease management, symptoms, goc with emotional support.  I spent 45 minutes providing this consultation,  from 1:15pm to 2:00pm. More than 50% of the time in this consultation was spent coordinating communication.   HISTORY OF PRESENT ILLNESS:  Justin [name] is a 67 y.o. year old male multiple medical problems including CVA with left-sided weakness, dysphasia including G tube for tube feedings essential to  sustain life, atrial fibrillation, hypertension, hyperlipidemia, dysarthria. [name] [facility] at [facility], bed-bound lift to the chair recliner. 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 [name] is fed by tube feeding through gastrostomy tube essential to sustain life. [name] is cognitively impaired and unable to verbalize his needs. Followed by Psychiatry with last date of service 9/26 / 2019 for vascular dementia and mood disorder prescribe Zoloft. 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 Takes baclofen with Tramadol for pain with effective relief. Started GDR of Zoloft this visit. Last primary provider note 11 / 8 / 2019 for monthly visit hypertension, stable no changes at this visit. No recent wounds, infections, Falls, hospitalizations. Medical goals of care to include wishes are for DNR, no blood transfusion, new hospitalization, note intubation, new cervical interventions but wishes are for IV fluids, antibiotics, diagnostic testing, feeding tube what she currently has, lab tests. At present [name] is lying in bed. [name] appears debilitated but comfortable. No visitors present. Palliative Care was asked to help address goals of care.   CODE STATUS: DNR  PPS: 30% HOSPICE ELIGIBILITY/DIAGNOSIS: TBD  PAST MEDICAL HISTORY:  Past Medical History:  Diagnosis Date  . Atrial fibrillation (HCC)   . Hyperlipidemia   . Hypertension   . Seizure (HCC)    ON KEPPRA  . Stroke [facility])     SOCIAL HX:  Social History   Tobacco Use  . Smoking status: Never Smoker  . Smokeless tobacco: Never Used  Substance Use Topics  . Alcohol use: No    ALLERGIES: No Known Allergies   PERTINENT  MEDICATIONS:  Outpatient Encounter Medications as of 04/01/2018  Medication Sig  . acetaminophen (TYLENOL) 325 MG tablet 650 mg by Gastric Tube route every 4 (four) hours as needed for mild pain or moderate pain.  Marland Kitchen. atorvastatin (LIPITOR) 20 MG tablet Take 20 mg by mouth at  bedtime.   . baclofen (LIORESAL) 10 MG tablet Take 10 mg by mouth 3 (three) times daily.  . Cholecalciferol (VITAMIN D3) 5000 units TABS 5,000 Units by Gastric Tube route daily.  Marland Kitchen. diltiazem (CARDIZEM) 60 MG tablet Take 1 tablet (60 mg total) by mouth every 8 (eight) hours.  Marland Kitchen. ipratropium-albuterol (DUONEB) 0.5-2.5 (3) MG/3ML SOLN Take 3 mLs by nebulization every 6 (six) hours as needed.  . levETIRAcetam (KEPPRA) 100 MG/ML solution Place 500 mg into feeding tube 2 (two) times daily.  Marland Kitchen. LORazepam (ATIVAN) 2 MG/ML concentrated solution Take 1 mg by mouth every 6 (six) hours as needed for seizure.  . Menthol, Topical Analgesic, (BIOFREEZE) 4 % GEL Apply 1 application topically 2 (two) times daily. Apply to left shoulder and elbow for arthritis.  . metoprolol tartrate (LOPRESSOR) 50 MG tablet Take 1 tablet (50 mg total) by mouth 2 (two) times daily.  . Multiple Vitamins-Minerals (MULTIVITAMIN WITH MINERALS) tablet Take 1 tablet by mouth daily.  . Nutritional Supplements (FEEDING SUPPLEMENT, JEVITY 1.2 CAL,) LIQD Place 1,000 mLs into feeding tube continuous. 75ml per hour (Patient not taking: Reported on 01/05/2017)  . nystatin (MYCOSTATIN) 100000 UNIT/ML suspension Take 5 mLs (500,000 Units total) by mouth 4 (four) times daily. (Patient not taking: Reported on 01/05/2017)  . omeprazole (PRILOSEC) 2 mg/mL SUSP Take 40 mg by mouth every 12 (twelve) hours.  . rivaroxaban (XARELTO) 20 MG TABS tablet Take 20 mg by mouth daily with supper.  . traMADol (ULTRAM) 50 MG tablet Take 1 tablet (50 mg total) by mouth every 12 (twelve) hours as needed for moderate pain. (Patient not taking: Reported on 01/05/2017)  . vancomycin (VANCOCIN) 50 mg/mL oral solution Place 10 mLs (500 mg total) into feeding tube every 6 (six) hours.  . Water For Irrigation, Sterile (FREE WATER) SOLN Place 100 mLs into feeding tube every 8 (eight) hours. (Patient not taking: Reported on 01/05/2017)   No facility-administered encounter  medications on file as of 04/01/2018.     PHYSICAL EXAM:   General: NAD, debilitated male, bedbound, cognitively impaired Cardiovascular: regular rate and rhythm Pulmonary: clear ant fields Abdomen: soft, nontender, + bowel sounds; +g-tube GU: no suprapubic tenderness Extremities: mild edema Skin: no rashes Neurological: functional quadriplegic, generalized weakness   Prince RomeZ , NP

## 2018-04-02 ENCOUNTER — Telehealth: Payer: Self-pay | Admitting: Nurse Practitioner

## 2018-04-09 NOTE — Telephone Encounter (Signed)
P/C

## 2018-06-18 ENCOUNTER — Non-Acute Institutional Stay: Payer: Medicare Other | Admitting: Nurse Practitioner

## 2018-06-18 ENCOUNTER — Encounter: Payer: Self-pay | Admitting: Nurse Practitioner

## 2018-06-18 VITALS — BP 120/71 | HR 68 | Temp 98.1°F | Resp 18 | Ht 69.0 in | Wt 172.5 lb

## 2018-06-18 DIAGNOSIS — R531 Weakness: Secondary | ICD-10-CM

## 2018-06-18 DIAGNOSIS — Z515 Encounter for palliative care: Secondary | ICD-10-CM

## 2018-06-18 DIAGNOSIS — R413 Other amnesia: Secondary | ICD-10-CM | POA: Insufficient documentation

## 2018-06-18 NOTE — Progress Notes (Signed)
Therapist, nutritional Palliative Care Consult Note Telephone: [phone]  Fax: [phone]  PATIENT NAME: Justin [name] DOB: 04/22/1951 MRN: [id number]  PRIMARY CARE PROVIDER:   Dimple Casey, MD  REFERRING PROVIDER:  Dr [facility] RESPONSIBLE PARTY:   Florene Route sister [phone]  RECOMMENDATIONS and PLAN:  1.Generalized weaknessR53.1  secondary to late onset CVA. Continue to encourage to get out of bed as able, restorative exercises.   2. Memory loss R41.3 secondary to late onset CVA. Continue with supportive measures as chronic disease remains Progressive.  3. Palliative care encounter Z51.5; Ongoing monitoring chronic disease management, symptoms, goc with emotional support.  Medical goals include DNR, do not intubate, do not hospitalize, new blood transfusions, new surgical interventions but wishes are for antibiotics, diagnostic testing, feeding tube, IV hydration, lab testing  ASSESSMENT:  I visit and observed Justin [name]. He did open his eyes to verbal cues but then close them quickly. He was cooperative with assessment. He has seemed to improve from pneumonia and back to Baseline. He has had a slight weight gain. He does have a pureed diet order though he is not been getting any oral pleasure feedings only tube feedings. Medical goals have been to focus more on comfort with DNR in place though treat what is treatable. Emotional support provided. Will continue to follow monitor with palliative care. Will contact Merdis Delay his sister for update on palliative care visit. I updated nursing staff in the new changes to current goals are plan of care.  BMI 25.5 12 / 5 / 2019 weight 168.3 lbs 1 / 9 / 2020 weight 162.3 lbs 2 / 6 / 2020 weight 172.5 lbs  1 / 9 / 2020 WBC 8.3, hemoglobin 15.6, hematocrit 46.5, platelets 10.8, sodium 140, potassium 4.4, chloride 100, Co2 25, bun12.6, creatinine 0.49, glucose 87, albumin 4.2, total protein  8.1, vitamin D 53.23, keppra 11.6  I spent 45 minutes providing this consultation,  from 8:30am to 9:15am. More than 50% of the time in this consultation was spent coordinating communication.   HISTORY OF PRESENT ILLNESS:  Justin [name] is a 68 y.o. year old male with multiple medical problems including CVA with left-sided weakness, dysphasia including G tube for tube feedings essential to sustain life, atrial fibrillation, hypertension, hyperlipidemia, dysarthria. He continues to reside at [facility]. He does remain bed bound, total [place] dependents with incontinence bowel and bladder. He is fed through gastrostomy tube feeding essential to sustain life. Last primary provider note 1/9 / 2024 comprehensive review and acute visit of pneumonia for which he was prescribed IV antibiotics. No seizure activity since 11 / 27 / 2019 and medication adjusted at that time. He is followed by Podiatry at the facility. No recent wounds, Falls, hospitalizations. He is nonverbal. At present he is lying in bed with his eyes closed. He appears comfortable. No visitors present.  Palliative Care was asked to help address goals of care.   CODE STATUS: DNR  PPS: 30% HOSPICE ELIGIBILITY/DIAGNOSIS: TBD  PAST MEDICAL HISTORY:  Past Medical History:  Diagnosis Date  . Atrial fibrillation (HCC)   . Hyperlipidemia   . Hypertension   . Seizure (HCC)    ON KEPPRA  . Stroke [facility])     SOCIAL HX:  Social History   Tobacco Use  . Smoking status: Never Smoker  . Smokeless tobacco: Never Used  Substance Use Topics  . Alcohol use: No    ALLERGIES:  No Known Allergies   PERTINENT MEDICATIONS:  Outpatient Encounter Medications as of 06/18/2018  Medication Sig  . acetaminophen (TYLENOL) 325 MG tablet 650 mg by Gastric Tube route every 4 (four) hours as needed for mild pain or moderate pain.  Marland Kitchen atorvastatin (LIPITOR) 20 MG tablet Take 20 mg by mouth at bedtime.   .  baclofen (LIORESAL) 10 MG tablet Take 10 mg by mouth 3 (three) times daily.  . Cholecalciferol (VITAMIN D3) 5000 units TABS 5,000 Units by Gastric Tube route daily.  Marland Kitchen diltiazem (CARDIZEM) 60 MG tablet Take 1 tablet (60 mg total) by mouth every 8 (eight) hours.  Marland Kitchen ipratropium-albuterol (DUONEB) 0.5-2.5 (3) MG/3ML SOLN Take 3 mLs by nebulization every 6 (six) hours as needed.  . levETIRAcetam (KEPPRA) 100 MG/ML solution Place 500 mg into feeding tube 2 (two) times daily.  Marland Kitchen LORazepam (ATIVAN) 2 MG/ML concentrated solution Take 1 mg by mouth every 6 (six) hours as needed for seizure.  . Menthol, Topical Analgesic, (BIOFREEZE) 4 % GEL Apply 1 application topically 2 (two) times daily. Apply to left shoulder and elbow for arthritis.  . metoprolol tartrate (LOPRESSOR) 50 MG tablet Take 1 tablet (50 mg total) by mouth 2 (two) times daily.  . Multiple Vitamins-Minerals (MULTIVITAMIN WITH MINERALS) tablet Take 1 tablet by mouth daily.  . Nutritional Supplements (FEEDING SUPPLEMENT, JEVITY 1.2 CAL,) LIQD Place 1,000 mLs into feeding tube continuous. 59ml per hour (Patient not taking: Reported on 01/05/2017)  . nystatin (MYCOSTATIN) 100000 UNIT/ML suspension Take 5 mLs (500,000 Units total) by mouth 4 (four) times daily. (Patient not taking: Reported on 01/05/2017)  . omeprazole (PRILOSEC) 2 mg/mL SUSP Take 40 mg by mouth every 12 (twelve) hours.  . rivaroxaban (XARELTO) 20 MG TABS tablet Take 20 mg by mouth daily with supper.  . traMADol (ULTRAM) 50 MG tablet Take 1 tablet (50 mg total) by mouth every 12 (twelve) hours as needed for moderate pain. (Patient not taking: Reported on 01/05/2017)  . vancomycin (VANCOCIN) 50 mg/mL oral solution Place 10 mLs (500 mg total) into feeding tube every 6 (six) hours. (Patient not taking: Reported on 04/01/2018)  . Water For Irrigation, Sterile (FREE WATER) SOLN Place 100 mLs into feeding tube every 8 (eight) hours. (Patient not taking: Reported on 01/05/2017)   No  facility-administered encounter medications on file as of 06/18/2018.     PHYSICAL EXAM:   General: NAD, chronically ill, debiltiated male Cardiovascular: regular rate and rhythm Pulmonary: clear ant fields Abdomen: soft, nontender, + bowel sounds/g-tube GU: no suprapubic tenderness Extremities: mild BLE edema, no joint deformities Skin: no rashes Neurological: Weakness but otherwise nonfocal/functional quadriplegic  Christin Prince Rome, NP

## 2018-07-16 ENCOUNTER — Encounter: Payer: Self-pay | Admitting: Nurse Practitioner

## 2018-07-16 ENCOUNTER — Non-Acute Institutional Stay: Payer: Medicare Other | Admitting: Nurse Practitioner

## 2018-07-16 ENCOUNTER — Other Ambulatory Visit: Payer: Self-pay

## 2018-07-16 VITALS — BP 110/65 | HR 72 | Temp 98.6°F | Resp 18 | Ht 69.0 in | Wt 169.8 lb

## 2018-07-16 DIAGNOSIS — R413 Other amnesia: Secondary | ICD-10-CM

## 2018-07-16 DIAGNOSIS — R531 Weakness: Secondary | ICD-10-CM

## 2018-07-16 DIAGNOSIS — Z515 Encounter for palliative care: Secondary | ICD-10-CM

## 2018-07-16 NOTE — Progress Notes (Signed)
Therapist, nutritional Palliative Care Consult Note Telephone: [phone]  Fax: [phone]  PATIENT NAME: Justin [name] DOB: 11-Oct-1950 MRN: [id number]  PRIMARY CARE PROVIDER:   Dimple Casey, MD  REFERRING PROVIDER:  Dr [facility] RESPONSIBLE PARTY:   Florene Route sister [phone]  RECOMMENDATIONS and PLAN:  1.Generalized weaknessR53.1 secondary to late onset CVA. Continue to encourage to get out of bed as able, restorative exercises.  2.Memory loss R41.3 secondary tolate onset CVA. Continue with supportive measures as chronic disease remains Progressive.  3.Palliative care encounter Z51.5; Ongoing monitoring chronic disease management, symptoms, goc with emotional support.  Medical goals include DNR, do not intubate, do not hospitalize, new blood transfusions, new surgical interventions but wishes are for antibiotics, diagnostic testing, feeding tube, IV hydration, lab testing  I spent 45 minutes providing this consultation,  From 1:15pm to 2:00pm. More than 50% of the time in this consultation was spent coordinating communication.   ASSESSMENT:    I visit and observed Justin [name]. He did make eye contact with verbal cues. He was cooperative with assessment. No meaningful discussion due to cognitive impairment and language deficit. He does continue to remain stable in the city of chronic disease of cerebral infarction. I have attempted to contact his sister [employer] power of attorney on palliative care visit with message left. I updated nursing no new changes to current goals or plan of care. Emotional support provided.  12 / 5 / 2019 weight 168.3 lbs 1 / 9 / 2020 weight 162.3 lbs 2 / 6 / 2020 weight 172.5 lbs  3 / 4 / 2,020 weight 169.8 lbs 3 / 19 / 2020 weight 169.8 lbs BMI 25.1  HISTORY OF PRESENT ILLNESS:  Justin [name] is a 68 y.o. year old male with multiple medical problems including CVA with left-sided  weakness, dysphasia including G tube for tube feedings essential to sustain life, atrial fibrillation, hypertension, hyperlipidemia, dysarthria. Justin [name] continue to reside at skilled [facility]. He does remain bed-bound in the same room as his brother. He is total ADL dependent and lift to a recliner.Marland Kitchen He is incontinent bowel and bladder. He does receive his nutrition through a gastrostomy tube feedings. 3 / 3 / 2,020 care plan meeting was held and no new changes to current goals are plan of care. He is followed by the wound Care at the facility for left lateral thigh wound 12 cm x 6 cm x 0.2 cm. No recent Falls, hospitalizations, infections. Last primary provider visit 3 / 9 / 2024 anemia with hemoglobin and hematocrit stable no evidence of bleeding. No recent seizure activity. He is cognitively impaired, unable to verbalize his needs. At present he is lying in bed, appears debilitated chronically ill but comfortable. No visitors present. Palliative Care was asked to help address goals of care.   CODE STATUS: DNR  PPS: 30% HOSPICE ELIGIBILITY/DIAGNOSIS: TBD  PAST MEDICAL HISTORY:  Past Medical History:  Diagnosis Date  . Atrial fibrillation (HCC)   . Hyperlipidemia   . Hypertension   . Seizure (HCC)    ON KEPPRA  . Stroke [facility])     SOCIAL HX:  Social History   Tobacco Use  . Smoking status: Never Smoker  . Smokeless tobacco: Never Used  Substance Use Topics  . Alcohol use: No    ALLERGIES: No Known Allergies   PERTINENT MEDICATIONS:  Outpatient Encounter Medications as of 07/16/2018  Medication Sig  . acetaminophen (TYLENOL) 325 MG tablet  650 mg by Gastric Tube route every 4 (four) hours as needed for mild pain or moderate pain.  Marland Kitchen atorvastatin (LIPITOR) 20 MG tablet Take 20 mg by mouth at bedtime.   . baclofen (LIORESAL) 10 MG tablet Take 10 mg by mouth 3 (three) times daily.  . Cholecalciferol (VITAMIN D3) 5000 units TABS 5,000 Units by Gastric Tube route  daily.  Marland Kitchen diltiazem (CARDIZEM) 60 MG tablet Take 1 tablet (60 mg total) by mouth every 8 (eight) hours.  Marland Kitchen ipratropium-albuterol (DUONEB) 0.5-2.5 (3) MG/3ML SOLN Take 3 mLs by nebulization every 6 (six) hours as needed.  . levETIRAcetam (KEPPRA) 100 MG/ML solution Place 500 mg into feeding tube 2 (two) times daily.  Marland Kitchen LORazepam (ATIVAN) 2 MG/ML concentrated solution Take 1 mg by mouth every 6 (six) hours as needed for seizure.  . Menthol, Topical Analgesic, (BIOFREEZE) 4 % GEL Apply 1 application topically 2 (two) times daily. Apply to left shoulder and elbow for arthritis.  . metoprolol tartrate (LOPRESSOR) 50 MG tablet Take 1 tablet (50 mg total) by mouth 2 (two) times daily.  . Multiple Vitamins-Minerals (MULTIVITAMIN WITH MINERALS) tablet Take 1 tablet by mouth daily.  . Nutritional Supplements (FEEDING SUPPLEMENT, JEVITY 1.2 CAL,) LIQD Place 1,000 mLs into feeding tube continuous. 59ml per hour (Patient not taking: Reported on 01/05/2017)  . nystatin (MYCOSTATIN) 100000 UNIT/ML suspension Take 5 mLs (500,000 Units total) by mouth 4 (four) times daily. (Patient not taking: Reported on 01/05/2017)  . omeprazole (PRILOSEC) 2 mg/mL SUSP Take 40 mg by mouth every 12 (twelve) hours.  . rivaroxaban (XARELTO) 20 MG TABS tablet Take 20 mg by mouth daily with supper.  . traMADol (ULTRAM) 50 MG tablet Take 1 tablet (50 mg total) by mouth every 12 (twelve) hours as needed for moderate pain. (Patient not taking: Reported on 01/05/2017)  . vancomycin (VANCOCIN) 50 mg/mL oral solution Place 10 mLs (500 mg total) into feeding tube every 6 (six) hours. (Patient not taking: Reported on 04/01/2018)  . Water For Irrigation, Sterile (FREE WATER) SOLN Place 100 mLs into feeding tube every 8 (eight) hours. (Patient not taking: Reported on 01/05/2017)   No facility-administered encounter medications on file as of 07/16/2018.     PHYSICAL EXAM:   General: chronically ill, debilitated, cognitively impaired male  Cardiovascular: regular rate and rhythm Pulmonary: clear ant fields Abdomen: soft, nontender, + bowel sounds/g-tube GU: no suprapubic tenderness Extremities: no edema, no joint deformities Skin: no rashes Neurological: Weakness but otherwise nonfocal/functional quadriplegic  Verle Brillhart Prince Rome, NP

## 2018-09-11 ENCOUNTER — Non-Acute Institutional Stay: Payer: Medicare Other | Admitting: Nurse Practitioner

## 2018-09-11 ENCOUNTER — Other Ambulatory Visit: Payer: Self-pay

## 2018-09-11 ENCOUNTER — Encounter: Payer: Self-pay | Admitting: Nurse Practitioner

## 2018-09-11 VITALS — BP 119/73 | HR 77 | Temp 98.5°F | Resp 18 | Ht 69.0 in | Wt 171.0 lb

## 2018-09-11 DIAGNOSIS — Z515 Encounter for palliative care: Secondary | ICD-10-CM

## 2018-09-11 DIAGNOSIS — R531 Weakness: Secondary | ICD-10-CM

## 2018-09-11 DIAGNOSIS — R413 Other amnesia: Secondary | ICD-10-CM

## 2018-09-11 NOTE — Progress Notes (Signed)
[facility] Consult Note Telephone: [phone]  Fax: [phone]  PATIENT NAME: Justin LippsMilton Denslow DOB: [birth year] MRN: [id number]  PRIMARY CARE PROVIDER:   Dimple CaseySmith, Sean A, MD  REFERRING PROVIDER:  [facility] RESPONSIBLE PARTY:  Florene RouteBernie's [place] sister [phone]  1. Palliative care encounter Z51.5; Ongoing monitoring chronic disease management, symptoms, goc with emotional support.   2.Generalized weaknessR53.1  secondary to late onset CVA. Continue to encourage to get out of bed as able, restorative exercises.   3. Memory loss R41.3 secondary to late onset CVA. Continue with supportive measures as chronic disease remains Progressive.  ASSESSMENT:   Medical goals to include DNR, do not in today, do not hospitalize, no blood transfusion, no surgical intervention but wishes are for IV fluids, antibiotic therapy, diagnostic testing, feeding tube, lab testing  I visited and Mr. [name]. He did open his eyes to verbal cues. He was nonverbal and limited interaction with cognitive impairment. He does appear comfortable. He was cooperative with assessment. No recent seizure activity. He does appear to be stable currently and continues to room with his brother at the facility. Emotional support provided.  1 / 9 / 2020 WBC 8.3, hemoglobin 15.5, hematocrit 46.5, platelets 184, sodium 140, potassium 4.4, chloride 100, co2 25, calcium 9.6, bun 12.6, creatinine 0.49, glucose 87, total protein 8.1, albumin 4.2  BMI 25.3 4 / 3 / 2020 weight 173.9 lbs 5 / 6 / 2020 weight 171.1 lbs   I spent 45 minutes providing this consultation,  from 9:00am to 9:45am. More than 50% of the time in this consultation was spent coordinating communication.   HISTORY OF PRESENT ILLNESS:  Justin [name] is a 68 y.o. year old male with multiple medical problems including CVA with left-sided weakness, dysphasia including G tube for tube feedings essential to  sustain life, atrial fibrillation, hypertension, hyperlipidemia, dysarthria.Mr. [name] [facility] to [facility]. He is total ADL dependence, bed bound and requires to be turn, positions with pillows. He is able to sit up in a wheelchair but has not gotten up in some time. He is fed through gastrostomy tube tube feedings essential to sustain life. He does continue to engage in independent individualized activities to promote quality of life such as listening to music or having the TV on in the room. No recent falls, infections, hospitalizations, seizure activity. He did have a left bilateral wound which now has been resolved. He is followed by Podiatry at the facility. Last primary provider visit 4 / 13 / 2020 for hyperlipidemia for what she is no longer on a Statin due to elevated lfts. Repeated lfts were normal with last lab draw. At present he is lying in bed asleep. He does open his eyes to verbal cues but no verbal response. He does appear debilitated secondary to late onset CVA but comfortable. No visitors present. Palliative Care was asked to help to continue to address goals of care.   CODE STATUS: DNR  PPS: 30% HOSPICE ELIGIBILITY/DIAGNOSIS: TBD  PAST MEDICAL HISTORY:  Past Medical History:  Diagnosis Date  . Atrial fibrillation (HCC)   . Hyperlipidemia   . Hypertension   . Seizure (HCC)    ON KEPPRA  . Stroke [facility](HCC)     SOCIAL HX:  Social History   Tobacco Use  . Smoking status: Never Smoker  . Smokeless tobacco: Never Used  Substance Use Topics  . Alcohol use: No  ALLERGIES: No Known Allergies   PERTINENT MEDICATIONS:  Outpatient Encounter Medications as of 09/11/2018  Medication Sig  . diltiazem (CARDIZEM) 60 MG tablet Take 1 tablet (60 mg total) by mouth every 8 (eight) hours.  . Menthol, Topical Analgesic, (BIOFREEZE) 4 % GEL Apply 1 application topically 2 (two) times daily. Apply to left shoulder and  elbow for arthritis.  Marland Kitchen omeprazole (PRILOSEC) 2 mg/mL SUSP Take 40 mg by mouth every 12 (twelve) hours.  . rivaroxaban (XARELTO) 20 MG TABS tablet Take 20 mg by mouth daily with supper.  . Nutritional Supplements (FEEDING SUPPLEMENT, JEVITY 1.2 CAL,) LIQD Place 1,000 mLs into feeding tube continuous. 24ml per hour (Patient not taking: Reported on 01/05/2017)  . Water For Irrigation, Sterile (FREE WATER) SOLN Place 100 mLs into feeding tube every 8 (eight) hours. (Patient not taking: Reported on 01/05/2017)  . [DISCONTINUED] acetaminophen (TYLENOL) 325 MG tablet 650 mg by Gastric Tube route every 4 (four) hours as needed for mild pain or moderate pain.  . [DISCONTINUED] atorvastatin (LIPITOR) 20 MG tablet Take 20 mg by mouth at bedtime.   . [DISCONTINUED] baclofen (LIORESAL) 10 MG tablet Take 10 mg by mouth 3 (three) times daily.  . [DISCONTINUED] Cholecalciferol (VITAMIN D3) 5000 units TABS 5,000 Units by Gastric Tube route daily.  . [DISCONTINUED] ipratropium-albuterol (DUONEB) 0.5-2.5 (3) MG/3ML SOLN Take 3 mLs by nebulization every 6 (six) hours as needed.  . [DISCONTINUED] levETIRAcetam (KEPPRA) 100 MG/ML solution Place 500 mg into feeding tube 2 (two) times daily.  . [DISCONTINUED] LORazepam (ATIVAN) 2 MG/ML concentrated solution Take 1 mg by mouth every 6 (six) hours as needed for seizure.  . [DISCONTINUED] metoprolol tartrate (LOPRESSOR) 50 MG tablet Take 1 tablet (50 mg total) by mouth 2 (two) times daily. (Patient not taking: Reported on 09/11/2018)  . [DISCONTINUED] Multiple Vitamins-Minerals (MULTIVITAMIN WITH MINERALS) tablet Take 1 tablet by mouth daily.  . [DISCONTINUED] nystatin (MYCOSTATIN) 100000 UNIT/ML suspension Take 5 mLs (500,000 Units total) by mouth 4 (four) times daily. (Patient not taking: Reported on 01/05/2017)  . [DISCONTINUED] traMADol (ULTRAM) 50 MG tablet Take 1 tablet (50 mg total) by mouth every 12 (twelve) hours as needed for moderate pain. (Patient not taking: Reported  on 01/05/2017)  . [DISCONTINUED] vancomycin (VANCOCIN) 50 mg/mL oral solution Place 10 mLs (500 mg total) into feeding tube every 6 (six) hours. (Patient not taking: Reported on 04/01/2018)   No facility-administered encounter medications on file as of 09/11/2018.     PHYSICAL EXAM:   General: debilitated, chronically ill male, non-verbal Cardiovascular: regular rate and rhythm Pulmonary: clear ant fields Abdomen: soft, nontender, + bowel sounds/gastrectomy tube GU: no suprapubic tenderness Extremities: no edema, no joint deformities Skin: no rashes Neurological: functional quadriplegic  Takai Chiaramonte Prince Rome, NP

## 2018-09-12 ENCOUNTER — Telehealth: Payer: Self-pay | Admitting: Nurse Practitioner

## 2018-09-12 NOTE — Telephone Encounter (Signed)
I called Justin [name] sister [phone] with update on PC visit. We talked about GOC, DNR in place. We talked about MOST form and hard choice book and Justin [name] in agreement to reviewing with further discussion. We talked about impact of COVID pandemic. Justin [name] endorses she has been able to see [name] by activity department face-timing. Justin [name] endorses she was very thankful for PC visit, update. No new changes to GOC or plan of care. Therapeutic listening and emotional support provided. Questions answered to satisfaction. Contact information given.

## 2018-11-08 ENCOUNTER — Non-Acute Institutional Stay: Payer: Medicare Other | Admitting: Nurse Practitioner

## 2018-11-08 ENCOUNTER — Encounter: Payer: Self-pay | Admitting: Nurse Practitioner

## 2018-11-08 VITALS — BP 154/79 | HR 66 | Temp 97.8°F | Resp 18 | Wt 178.0 lb

## 2018-11-08 DIAGNOSIS — R413 Other amnesia: Secondary | ICD-10-CM

## 2018-11-08 DIAGNOSIS — R531 Weakness: Secondary | ICD-10-CM

## 2018-11-08 DIAGNOSIS — Z515 Encounter for palliative care: Secondary | ICD-10-CM

## 2018-11-08 NOTE — Progress Notes (Signed)
Justin [name] Consult Note Telephone: [phone]  Fax: [phone]  PATIENT NAME: Justin [name] DOB: May 22, 1950 MRN: [id number]  PRIMARY CARE PROVIDER:  Dr Yves Dill PROVIDER:  Dr Hodges/[facility] RESPONSIBLE PARTY:   Carlynn Spry sister [phone]  RECOMMENDATIONS and PLAN:  1.Generalized weaknessR53.1 secondary to late onset CVA. Continue to encourage to get out of bed as able, restorative exercises.  2.Memory loss R41.3 secondary tolate onset CVA. Continue with supportive measures as chronic disease remains Progressive.  3.Palliative care encounter Z51.5; Ongoing monitoring chronic disease management, symptoms, goc with emotional support.  Medical goals include DNR, do not intubate, do not hospitalize, new blood transfusions, new surgical interventions but wishes are for antibiotics, diagnostic testing, feeding tube, IV hydration, lab testing  ASSESSMENT:     I visited and observed [name]. He did make eye contact with verbal cues but then went back to sleep. He was cooperative with assessment. He does continue to appear stable at present time. No meaningful discussion with cognitive impairment. He does continue to receive two feedings essential to sustain life. I have attempted to contact his sister [name] who is this health care power-of-attorney with message left. I have updated nursing staff know any changes to current goals are plan of care as he does he does appear to be stable in the setting of late-onset CVA reflective of weight gain, no recent infections, no recent seizure activity. I have dated nursing staff and any changes to current goals or plan of care.   I spent 45 minutes providing this consultation,  from 11:00am to 11:45am. More than 50% of the time in this consultation was spent coordinating communication.   HISTORY OF PRESENT ILLNESS:  Justin [name] is a 68 y.o. year old male  with multiple medical problems including CVA with left-sided weakness, dysphasia including G tube for tube feedings essential to sustain life, atrial fibrillation, hypertension, hyperlipidemia, dysarthria. Mr. Roback continues to reside at [place] at [facility]. He continues to remain bed-bound, [facility] functioning quadriplegic. He does require to be positioned in turned. He is incontinent bowel and bladder. He does receive tube feeding essential to sustain life. Weights have been stable. He is cognitively impaired. He will make eye contact and not at times two questions with orientation to self. He continues to be in the same room as his brother for long-term care who is also severely debilitated. Last primary provider visit 7/20 / 2020 for comprehensive review chronic condition. Last reported seizure 11 / 27 / 2019 with  medication adjusted no seizure activity since. No recent COPD exacerbations. Blood pressure is controlled. No signs or symptoms of recurrent TIA/ CVA and remains on anticoagulation for secondary prevention. No recent Falls, infections, hospitalizations. Wishes are for focus Comfort, treat what is treatable. DNR is in place. At present [name] is lying in bed. He appears debilitated but comfortable. No visitors present.Palliative Care was asked to help continue to address goals of care.   CODE STATUS: DNR  PPS: 30% HOSPICE ELIGIBILITY/DIAGNOSIS: TBD  PAST MEDICAL HISTORY:  Past Medical History:  Diagnosis Date  . Atrial fibrillation ([place])   . Hyperlipidemia   . Hypertension   . Seizure ([place])    ON KEPPRA  . Stroke [facility])     SOCIAL HX:  Social History   Tobacco Use  . Smoking status: Never Smoker  . Smokeless tobacco: Never Used  Substance Use Topics  .  Alcohol use: No    ALLERGIES: No Known Allergies   PERTINENT MEDICATIONS:  Outpatient Encounter Medications as of 11/08/2018  Medication Sig  . diltiazem (CARDIZEM) 60  MG tablet Take 1 tablet (60 mg total) by mouth every 8 (eight) hours.  . Menthol, Topical Analgesic, (BIOFREEZE) 4 % GEL Apply 1 application topically 2 (two) times daily. Apply to left shoulder and elbow for arthritis.  . Nutritional Supplements (FEEDING SUPPLEMENT, JEVITY 1.2 CAL,) LIQD Place 1,000 mLs into feeding tube continuous. 75ml per hour  . omeprazole (PRILOSEC) 2 mg/mL SUSP Take 40 mg by mouth every 12 (twelve) hours.  . rivaroxaban (XARELTO) 20 MG TABS tablet Take 20 mg by mouth daily with supper.  . Water For Irrigation, Sterile (FREE WATER) SOLN Place 100 mLs into feeding tube every 8 (eight) hours.   No facility-administered encounter medications on file as of 11/08/2018.     PHYSICAL EXAM:   General: NAD,debilitated, chronically ill male Cardiovascular: regular rate and rhythm Pulmonary: clear ant fields Abdomen: soft, nontender, + bowel sounds; G-tube Extremities: mild BLE edema, no joint deformities Skin: no rashes Neurological: Weakness but otherwise nonfocal/functionally quadriplegic  Manali Mcelmurry Prince RomeZ Quanda Pavlicek, NP

## 2018-11-11 ENCOUNTER — Other Ambulatory Visit: Payer: Self-pay

## 2019-01-03 ENCOUNTER — Non-Acute Institutional Stay: Payer: Medicare Other | Admitting: Nurse Practitioner

## 2019-01-03 ENCOUNTER — Encounter: Payer: Self-pay | Admitting: Nurse Practitioner

## 2019-01-03 VITALS — BP 125/78 | HR 88 | Temp 98.8°F | Resp 20 | Wt 180.0 lb

## 2019-01-03 DIAGNOSIS — Z515 Encounter for palliative care: Secondary | ICD-10-CM

## 2019-01-03 NOTE — Progress Notes (Signed)
[facility] Pall[facility] Consult Note Telephone: [phone]  Fax: [phone]  PATIENT NAME: Justin [name] DOB: 1950/09/21 MRN: [id number]  PRIMARY CARE PROVIDER:   Dr Hodges/[facility] RESPONSIBLE PARTY:   Carlynn Spry sister [phone]  RECOMMENDATIONS and PLAN: 1.ACP: Medical goals of care to include DNR, DNI, do not hospitalize, no blood transfusion, no surgical intervention but wishes are for antibiotic therapy, diagnostic testing, feeding tube, IV fluids, lab testing  2.Memory loss  secondary tolate onset CVA. Continue with supportive measures as chronic disease remains Progressive.  3.Palliative care encounter Ongoing monitoring chronic disease management, symptoms, goc with emotional support. REFERRING PROVIDER:    I spent 45 minutes providing this consultation,  From 11:45am to 12:15pm. More than 50% of the time in this consultation was spent coordinating communication.   HISTORY OF PRESENT ILLNESS:  Justin [name] is a 68 y.o. year old male with multiple medical problems including CVA with left-sided weakness, dysphasia including G tube for tube feedings essential to sustain life, atrial fibrillation, hypertension, hyperlipidemia, dysarthria. Mr. Chirico continues to reside in [place] at [facility]. He's functioning quadriplegic, that down, total ADL assistance with incontinent bowel and bladder secondary to CVA. He is fed through gastrostomy tube tube feedings essential to sustain life. No recent hospitalizations, wounds, Falls, infections. 9 / 9 / 2020 he had an episode of seizure-like activity receiving Ativan until subsided. Mr. Enyeart brother currently his roommate at the facility has been sent to the hospital. They have been roommates for many years now at [facility]. Staff reports no new changes at present time for Mr. Rehfeldt. At present he is lying in bed,  sleeping. He appears debilitated but comfortable. No visitors present. He did arouse to verbal cues making eye contact but no verbal responses. He did go back to sleep during palliative care visit. He was cooperative. He does continue to appear stable as he receives tube feedings essential to sustain life. Medical goals are focusing on comfort with do not hospitalize, treat what is treatable and place and do not resuscitate. I have attempted to contact [place] sister for update on palliative care visit. I updated nursing staff many changes to current goals or plan of care. We'll continue to minutes seizures in place without a van as needed. Will follow up in two months if needed or sooner should he declined. I have updated nursing staff.  9 / 10 / 2020 WBC 10.3, hemoglobin 16.7, hematocrit 49.4, platelets 166, sodium 143, potassium 4.4, chloride, co 222, calcium 9.3, bu in 15.2, creatinine 0.54, glucose 127, albumin 3.9, total protein 7.8  Palliative Care was asked to help to continue to address goals of care.   CODE STATUS: DNR  PPS: 30% HOSPICE ELIGIBILITY/DIAGNOSIS: TBD  PAST MEDICAL HISTORY:  Past Medical History:  Diagnosis Date  . Atrial fibrillation ([place])   . Hyperlipidemia   . Hypertension   . Seizure ([place])    ON KEPPRA  . Stroke [facility])     SOCIAL HX:  Social History   Tobacco Use  . Smoking status: Never Smoker  . Smokeless tobacco: Never Used  Substance Use Topics  . Alcohol use: No    ALLERGIES: No Known Allergies   PERTINENT MEDICATIONS:  Outpatient Encounter Medications as of 01/03/2019  Medication Sig  . diltiazem (CARDIZEM) 60 MG tablet Take 1 tablet (60 mg total) by mouth every 8 (eight) hours.  . Menthol, Topical  Analgesic, (BIOFREEZE) 4 % GEL Apply 1 application topically 2 (two) times daily. Apply to left shoulder and elbow for arthritis.  . Nutritional Supplements (FEEDING SUPPLEMENT, JEVITY 1.2 CAL,) LIQD Place 1,000 mLs into  feeding tube continuous. 75ml per hour  . omeprazole (PRILOSEC) 2 mg/mL SUSP Take 40 mg by mouth every 12 (twelve) hours.  . rivaroxaban (XARELTO) 20 MG TABS tablet Take 20 mg by mouth daily with supper.  . Water For Irrigation, Sterile (FREE WATER) SOLN Place 100 mLs into feeding tube every 8 (eight) hours.   No facility-administered encounter medications on file as of 01/03/2019.     PHYSICAL EXAM:   General: NAD, debilitated, chronically ill, non-verbal male Cardiovascular: regular rate and rhythm Pulmonary: clear ant fields Abdomen: soft, nontender, + bowel sounds/g-tube GU: no suprapubic tenderness Extremities: mild BLD edema, no joint deformities Neurological: functionally quadriplegic  Christin Prince RomeZ Gusler, NP

## 2019-01-06 ENCOUNTER — Other Ambulatory Visit: Payer: Self-pay

## 2019-02-26 ENCOUNTER — Encounter: Payer: Self-pay | Admitting: Nurse Practitioner

## 2019-02-26 ENCOUNTER — Non-Acute Institutional Stay: Payer: Medicare Other | Admitting: Nurse Practitioner

## 2019-02-26 VITALS — BP 144/78 | HR 88 | Temp 97.1°F | Resp 20 | Wt 174.5 lb

## 2019-02-26 DIAGNOSIS — Z515 Encounter for palliative care: Secondary | ICD-10-CM

## 2019-02-26 NOTE — Progress Notes (Signed)
[place] Consult Note Telephone: [phone]  Fax: [phone]  PATIENT NAME: Justin [name] DOB: [birth year] MRN: [id number] PRIMARY CARE PROVIDER:  Dr Yves Dill PROVIDER:  Dr Hodges/[facility] RESPONSIBLE PARTY:   Carlynn Spry sister [phone]  RECOMMENDATIONS and PLAN: 1.Generalized weaknessR53.1 secondary to late onset CVA. Continue to encourage to get out of bed as able, restorative exercises.  2.Memory loss R41.3 secondary tolate onset CVA. Continue with supportive measures as chronic disease remains Progressive.  3.Palliative care encounter Z51.5; Ongoing monitoring chronic disease management, symptoms, goc with emotional support.  Medical goals include DNR, do not intubate, do not hospitalize, new blood transfusions, new surgical interventions but wishes are for antibiotics, diagnostic testing, feeding tube, IV hydration, lab testing  I spent 35 minutes providing this consultation,  from 11:10amto 11:45am. More than 50% of the time in this consultation was spent coordinating communication.   HISTORY OF PRESENT ILLNESS:  Justin [name] is a 68 y.o. year old male with multiple medical problems including CVA with left-sided weakness, dysphasia including G tube for tube feedings essential to sustain life, atrial fibrillation, hypertension, hyperlipidemia, dysarthria. Justin [name] continues to reside at [place] at [facility]. Justin [name] remains bed-bound, total ADL dependence requiring staff to turn in position him. He is incontinent bowel and bladder. He is fed tube feedings through a gastrostomy tube. Staff endorses he has been sleeping more. No recent Falls, hospitalizations, wounds. He was last seen by primary provider 02/03/2019 for monthly follow up with epilepsy  with last year's are reported 9 / 9 / 2020. At present time he is lying in bed, appears  severely debilitated, no distress. No visitors present. I visited and observed Justin [name]. He did open his eyes to make eye contact but then returned asleep. He was cooperative with assessment. He does appear to be debilitated. Breath sounds are coarse rhonchi. Discussed with [facility] Nurse practitioner will order a chest x-ray. I have attempted to contact his sister [name] for further discussion of goals of care. He is a DNR. At present time will continue current medical goals. I have updated nursing staff. I called [name], Mr. Cheuvront sister Healthcare power of attorney. Clinical update discussed. We talked about his congested cough. Chest x-ray pending. We talked about medical goals of care with wishes to treat what is treatable. DNR does remain in place. We talked about role of palliative care and plan of care. Will follow up in 1 week if needed or sooner should he decline. Therapeutic listening and emotional support provided. Questions answered the satisfaction. Contact information provided.  10 / 27 / 2020 covid screen negative  Palliative Care was asked to help to continue to address goals of care.   CODE STATUS: DNR  PPS: 30% HOSPICE ELIGIBILITY/DIAGNOSIS: TBD  PAST MEDICAL HISTORY:  Past Medical History:  Diagnosis Date   Atrial fibrillation ([place])    Hyperlipidemia    Hypertension    Seizure ([place])    ON KEPPRA   Stroke ([place])     SOCIAL HX:  Social History   Tobacco Use   Smoking status: Never Smoker   Smokeless tobacco: Never Used  Substance Use Topics   Alcohol use: No    ALLERGIES: No Known Allergies   PERTINENT MEDICATIONS:  Outpatient Encounter Medications as of 02/26/2019  Medication Sig   cholecalciferol (VITAMIN D3) 25 MCG (1000 UT) tablet Take 1,000 Units by mouth daily.   diltiazem (CARDIZEM) 60  MG tablet Take 1 tablet (60 mg total) by mouth every 8 (eight) hours.   ipratropium-albuterol (DUONEB) 0.5-2.5 (3) MG/3ML SOLN Take 3 mLs by nebulization every 6  (six) hours as needed.   levETIRAcetam (KEPPRA) 1000 MG tablet Take 1,000 mg by mouth 2 (two) times daily.   LORazepam (ATIVAN) 2 MG/ML concentrated solution Take 1 mg by mouth every 8 (eight) hours. As needed for seizure activity   Menthol, Topical Analgesic, (BIOFREEZE) 4 % GEL Apply 1 application topically 2 (two) times daily. Apply to left shoulder and elbow for arthritis.   metoprolol succinate (TOPROL-XL) 50 MG 24 hr tablet Take 50 mg by mouth daily. Take with or immediately following a meal.   Multiple Vitamin (MULTIVITAMIN) LIQD Take 5 mLs by mouth daily.   Nutritional Supplements (FEEDING SUPPLEMENT, JEVITY 1.2 CAL,) LIQD Place 1,000 mLs into feeding tube continuous. 84ml per hour   omeprazole (PRILOSEC) 2 mg/mL SUSP Take 40 mg by mouth every 12 (twelve) hours.   rivaroxaban (XARELTO) 20 MG TABS tablet Take 20 mg by mouth daily with supper.   sertraline (ZOLOFT) 20 MG/ML concentrated solution Take by mouth daily.   Water For Irrigation, Sterile (FREE WATER) SOLN Place 100 mLs into feeding tube every 8 (eight) hours.   No facility-administered encounter medications on file as of 02/26/2019.     PHYSICAL EXAM:   General: severely debilitated, chronically ill, cognitively impaired male Cardiovascular: regular rate and rhythm Pulmonary: coarse, rhonchi Abdomen: soft, nontender, + bowel sounds; g-tube Extremities: no edema, no joint deformities Neurological: functional quadriplegic  Hanif Radin Prince Rome, NP

## 2019-02-27 ENCOUNTER — Other Ambulatory Visit: Payer: Self-pay

## 2019-03-07 ENCOUNTER — Other Ambulatory Visit: Payer: Self-pay

## 2019-03-07 ENCOUNTER — Non-Acute Institutional Stay: Payer: Medicare Other | Admitting: Nurse Practitioner

## 2019-03-07 ENCOUNTER — Encounter: Payer: Self-pay | Admitting: Nurse Practitioner

## 2019-03-07 VITALS — BP 121/59 | HR 88 | Temp 98.0°F | Resp 18 | Wt 170.3 lb

## 2019-03-07 DIAGNOSIS — Z515 Encounter for palliative care: Secondary | ICD-10-CM

## 2019-03-07 DIAGNOSIS — I639 Cerebral infarction, unspecified: Secondary | ICD-10-CM

## 2019-03-07 NOTE — Progress Notes (Signed)
[facility] Consult Note Telephone: [phone]  Fax: [phone]  PATIENT NAME: Justin [name] DOB: Jan 12, 1951 MRN: [id number] PRIMARY CARE PROVIDER:Dr Yves Dill PROVIDER:Dr Hodges/[place] sister [phone]  RECOMMENDATIONS and PLAN: 1.Medical goals include DNR, do not intubate, do not hospitalize, new blood transfusions, new surgical interventions but wishes are for antibiotics, diagnostic testing, feeding tube, IV hydration, lab testing  2.Memory loss secondary tolate onset CVA. Continue with supportive measures as chronic disease remains Progressive.  3.Palliative care encounter; Ongoing monitoring chronic disease management, symptoms, goc with emotional support.  I spent 35 minutes providing this consultation,  from 9:30am to 10:05am. More than 50% of the time in this consultation was spent coordinating communication.   HISTORY OF PRESENT ILLNESS:  Justin [name] is a 68 y.o. year old male with multiple medical problems including CVA with left-sided weakness, dysphasia including G tube for tube feedings essential to sustain life, atrial fibrillation, hypertension, hyperlipidemia, dysarthria.Justin [name] continues to reside at [place] at [facility]. He does remain bed-bound, total ADL dependents requiring staff to turn and position him. He is in, and volume louder. Justin [name] is fed through a gastrostomy tube with tube feedings. Justin [name] has covid with a cough. He does make eye contact but has difficulty in making his needs known. His roommate is his brother you had a recent hospitalization.  At present he is lying in bed, appears debilitated, chronically ill, no distress. No visitors present. I visited and observed Justin [name]. He did make eye contact with verbal cues. He was cooperative with [place] estimate. No verbal responses. He does  appear to be slowly declining. He is a DNR, medical goals focus on [place]. I have attempted to contact [facility] for update. Hospice has previously been offered and she has declined will revisit. I have updated nursing staff the new changes to current goals or plan of care.  Palliative Care was asked to help to continue to address goals of care.   CODE STATUS: DNR  PPS: 30% HOSPICE ELIGIBILITY/DIAGNOSIS: TBD  PAST MEDICAL HISTORY:  Past Medical History:  Diagnosis Date  . Atrial fibrillation ([place])   . Hyperlipidemia   . Hypertension   . Seizure ([place])    ON KEPPRA  . Stroke [facility])     SOCIAL HX:  Social History   Tobacco Use  . Smoking status: Never Smoker  . Smokeless tobacco: Never Used  Substance Use Topics  . Alcohol use: No    ALLERGIES: No Known Allergies   PERTINENT MEDICATIONS:  Outpatient Encounter Medications as of 03/07/2019  Medication Sig  . cholecalciferol (VITAMIN D3) 25 MCG (1000 UT) tablet Take 1,000 Units by mouth daily.  Marland Kitchen diltiazem (CARDIZEM) 60 MG tablet Take 1 tablet (60 mg total) by mouth every 8 (eight) hours.  Marland Kitchen ipratropium-albuterol (DUONEB) 0.5-2.5 (3) MG/3ML SOLN Take 3 mLs by nebulization every 6 (six) hours as needed.  . levETIRAcetam (KEPPRA) 1000 MG tablet Take 1,000 mg by mouth 2 (two) times daily.  Marland Kitchen LORazepam (ATIVAN) 2 MG/ML concentrated solution Take 1 mg by mouth every 8 (eight) hours. As needed for seizure activity  . Menthol, Topical Analgesic, (BIOFREEZE) 4 % GEL Apply 1 application topically 2 (two) times daily. Apply to left shoulder and elbow for arthritis.  . metoprolol succinate (TOPROL-XL) 50 MG 24 hr tablet Take 50 mg by mouth daily. Take with or immediately following a meal.  . Multiple Vitamin (MULTIVITAMIN)  LIQD Take 5 mLs by mouth daily.  . Nutritional Supplements (FEEDING SUPPLEMENT, JEVITY 1.2 CAL,) LIQD Place 1,000 mLs into feeding tube continuous. 62ml per hour  . omeprazole (PRILOSEC) 2 mg/mL SUSP Take 40 mg by mouth  every 12 (twelve) hours.  . rivaroxaban (XARELTO) 20 MG TABS tablet Take 20 mg by mouth daily with supper.  . sertraline (ZOLOFT) 20 MG/ML concentrated solution Take by mouth daily.  . Water For Irrigation, Sterile (FREE WATER) SOLN Place 100 mLs into feeding tube every 8 (eight) hours.   No facility-administered encounter medications on file as of 03/07/2019.     PHYSICAL EXAM:   General: chronically ill, debilitated functionally, cognitive male Cardiovascular: regular rate and rhythm Pulmonary: poor air movement; rhonchi, coarse Abdomen: soft, nontender, + bowel sounds; g-tube Extremities: + edema, no joint deformities Neurological: functional quadriplegic  Justin Pola Prince Rome, NP

## 2019-04-02 ENCOUNTER — Non-Acute Institutional Stay: Payer: Medicare Other | Admitting: Nurse Practitioner

## 2019-04-02 ENCOUNTER — Encounter: Payer: Self-pay | Admitting: Nurse Practitioner

## 2019-04-02 VITALS — BP 122/64 | HR 64 | Temp 98.0°F | Resp 18 | Wt 177.3 lb

## 2019-04-02 DIAGNOSIS — Z515 Encounter for palliative care: Secondary | ICD-10-CM

## 2019-04-02 DIAGNOSIS — I639 Cerebral infarction, unspecified: Secondary | ICD-10-CM

## 2019-04-02 NOTE — Progress Notes (Signed)
[facility] Consult Note Telephone: [phone]  Fax: [phone]  PATIENT NAME: Justin [name] DOB: 16-Feb-1951 MRN: [id number] PRIMARY CARE PROVIDER:Dr Yves Dill PROVIDER:Dr Hodges/[place] sister [phone]  RECOMMENDATIONS and PLAN: 1.ACP: medical goals include DNR, do not intubate, do not hospitalize, new blood transfusions, new surgical interventions but wishes are for antibiotics, diagnostic testing, feeding tube, IV hydration, lab testing  2.Memory loss secondary tolate onset CVA. Continue with supportive measures as chronic disease remains Progressive.  3.Palliative care encounter; Ongoing monitoring chronic disease management, symptoms, goc with emotional support.  I spent 40 minutes providing this consultation,  from 10:20am to 11:00am. More than 50% of the time in this consultation was spent coordinating communication.   HISTORY OF PRESENT ILLNESS:  Justin [name] is a 68 y.o. year old male with multiple medical problems including CVA with left-sided weakness, dysphasia including G tube for tube feedings essential to sustain life, atrial fibrillation, hypertension, hyperlipidemia, dysarthria. Justin [name] continues to reside in [place] at [facility] Justin. Junious does require staff to transfer, turn and position him secondary to a stroke. Justin. [name] is total ADL dependents with incontinent of bowel and bladder. Justin. [name] is fed through tube feedings through a gastrostomy tube essential to sustain life. Staff reports no new changes no recent seizure activity. Last primary provider visit 11 / 17 / 2020 for follow-up hypertensive heart disease. No changes at visit. Last covid-19 test not detected on 11 / 24 / 2020. No recent falls, wounds. Care plan meeting was held 39 / 7 / 2020 with no changes to current goals or plan of care, Justin.  Mckelvie will remain a DNR. No recent hospitalizations, Falls, wounds. At present Justin. [name]. Justin. [name] is lying in bed with his eyes closed. Justin. [name] is debilitated, comfortable. No visitors present. I visited and observed Justin [name]. Justin. [name] did open his eyes to verbal cues but then return to sleep. Justin. [name] does appear comfortable at present time. Justin. [name] was cooperative with assessment. Emotional support provided. No meaningful discussion due to cognitive impairment. I have attempted to contact his sister Justin [name] for update on palliative care visit. Will continue current medical goals of care. I updated nursing staff new new changes that present time will follow up in one month if needed or sooner should he declined. I call Justin [name], Justin [name] sister update on palliative care visit. We talked about purposeof palliativecsre visit. We talked about family Dynamics. We talked about how Justin [name] has been doing, stable currently. We talked extensively about support system and medical goals of care. Discuss the role of palliative care and plan of care. Discuss will follow up in one month if needed or sooner said he declined. We talked about medical goals to remain treat what is treatable with DNR in place. Justin [name] endorses she was thankful for palliative care visit and update phone call.  Palliative Care was asked to help to continue to address goals of care.   CODE STATUS: DNR  PPS: 30% HOSPICE ELIGIBILITY/DIAGNOSIS: TBD  PAST MEDICAL HISTORY:  Past Medical History:  Diagnosis Date   Atrial fibrillation ([place])    Hyperlipidemia    Hypertension    Seizure ([place])    ON KEPPRA   Stroke [facility])     SOCIAL HX:  Social History   Tobacco Use   Smoking status: Never Smoker   Smokeless tobacco: Never Used  Substance  Use Topics   Alcohol use: No    ALLERGIES: No Known Allergies   PERTINENT MEDICATIONS:  Outpatient Encounter Medications as of 04/02/2019  Medication Sig   cholecalciferol (VITAMIN  D3) 25 MCG (1000 UT) tablet Take 1,000 Units by mouth daily.   diltiazem (CARDIZEM) 60 MG tablet Take 1 tablet (60 mg total) by mouth every 8 (eight) hours.   ipratropium-albuterol (DUONEB) 0.5-2.5 (3) MG/3ML SOLN Take 3 mLs by nebulization every 6 (six) hours as needed.   levETIRAcetam (KEPPRA) 1000 MG tablet Take 1,000 mg by mouth 2 (two) times daily.   LORazepam (ATIVAN) 2 MG/ML concentrated solution Take 1 mg by mouth every 8 (eight) hours. As needed for seizure activity   Menthol, Topical Analgesic, (BIOFREEZE) 4 % GEL Apply 1 application topically 2 (two) times daily. Apply to left shoulder and elbow for arthritis.   metoprolol succinate (TOPROL-XL) 50 MG 24 hr tablet Take 50 mg by mouth daily. Take with or immediately following a meal.   Multiple Vitamin (MULTIVITAMIN) LIQD Take 5 mLs by mouth daily.   Nutritional Supplements (FEEDING SUPPLEMENT, JEVITY 1.2 CAL,) LIQD Place 1,000 mLs into feeding tube continuous. 46ml per hour   omeprazole (PRILOSEC) 2 mg/mL SUSP Take 40 mg by mouth every 12 (twelve) hours.   rivaroxaban (XARELTO) 20 MG TABS tablet Take 20 mg by mouth daily with supper.   sertraline (ZOLOFT) 20 MG/ML concentrated solution Take by mouth daily.   Water For Irrigation, Sterile (FREE WATER) SOLN Place 100 mLs into feeding tube every 8 (eight) hours.   No facility-administered encounter medications on file as of 04/02/2019.     PHYSICAL EXAM:   General: debilitated, chronically ill male, cognitive impaired Cardiovascular: regular rate and rhythm Pulmonary: clear ant fields Abdomen: soft, nontender, + bowel sounds/g-tube Extremities:+ edema, no joint deformities; contracted Neurological: functionally quadripleigc  Ramia Sidney Z Avalie Oconnor, NP

## 2019-04-03 ENCOUNTER — Other Ambulatory Visit: Payer: Self-pay

## 2019-05-09 ENCOUNTER — Other Ambulatory Visit: Payer: Self-pay

## 2019-05-09 ENCOUNTER — Non-Acute Institutional Stay: Payer: Medicare Other | Admitting: Nurse Practitioner

## 2019-05-09 ENCOUNTER — Encounter: Payer: Self-pay | Admitting: Nurse Practitioner

## 2019-05-09 VITALS — BP 105/66 | HR 86 | Temp 97.4°F | Resp 18 | Wt 174.4 lb

## 2019-05-09 DIAGNOSIS — Z515 Encounter for palliative care: Secondary | ICD-10-CM

## 2019-05-09 DIAGNOSIS — I639 Cerebral infarction, unspecified: Secondary | ICD-10-CM

## 2019-05-09 NOTE — Progress Notes (Signed)
[facility] Consult Note Telephone: [phone]  Fax: [phone]  PATIENT NAME: Justin [name] DOB: 1951/01/21 MRN: [id number] PRIMARY CARE PROVIDER:Dr Yves Dill PROVIDER:Dr Hodges/[place] sister [phone]  RECOMMENDATIONS and PLAN: 1.ACP: medical goals include DNR, do not intubate, do not hospitalize, new blood transfusions, new surgical interventions but wishes are for antibiotics, diagnostic testing, feeding tube, IV hydration, lab testing  2.Memory loss secondary tolate onset CVA. Continue with supportive measures as chronic disease remains Progressive.  3.Palliative care encounter; Ongoing monitoring chronic disease management, symptoms, goc with emotional support.  I spent 35 minutes providing this consultation,  from 1:25pm to 2:00pm. More than 50% of the time in this consultation was spent coordinating communication.   HISTORY OF PRESENT ILLNESS:  Justin [name] is a 69 y.o. year old male with multiple medical problems including CVA with left-sided weakness, dysphasia including G tube for tube feedings essential to sustain life, atrial fibrillation, hypertension, hyperlipidemia, dysarthria.  Justin [name] continues to reside at [place] at [facility]. Justin [name] does require staff to turn and position him, mobility, ADL dependent with incontinence bowel and bladder. Justin [name] is fed through gastrostomy tube required to feedings essential to sustain life. Last primary provider no 12 / 14 / 2024 with no changes to current plan of care. No recent seizures, Falls, hospitalizations. Justin [name] is cognitively impaired. At present Justin [name] is lying in bed with his eyes closed. He did not open his eyes to verbal cues. Mr [name] was cooperative with assessment. Justin [name] does appear to be chronically ill, debilitated with slow progressive  decline. Medical goals continue to focus on treat what is treatable. I have attempted to contact Justin [name], Mr [name] sister for update on palliative care and further discussion of goals of care. Updated nursing staff no new changes to goals or plan of care. Palliative Care was asked to help to continue to address goals of care.   CODE STATUS: DNR  PPS: 30% HOSPICE ELIGIBILITY/DIAGNOSIS: TBD  PAST MEDICAL HISTORY:  Past Medical History:  Diagnosis Date  . Atrial fibrillation ([place])   . Hyperlipidemia   . Hypertension   . Seizure ([place])    ON KEPPRA  . Stroke [facility])     SOCIAL HX:  Social History   Tobacco Use  . Smoking status: Never Smoker  . Smokeless tobacco: Never Used  Substance Use Topics  . Alcohol use: No    ALLERGIES: No Known Allergies   PERTINENT MEDICATIONS:  Outpatient Encounter Medications as of 05/09/2019  Medication Sig  . cholecalciferol (VITAMIN D3) 25 MCG (1000 UT) tablet Take 1,000 Units by mouth daily.  Marland Kitchen diltiazem (CARDIZEM) 60 MG tablet Take 1 tablet (60 mg total) by mouth every 8 (eight) hours.  Marland Kitchen ipratropium-albuterol (DUONEB) 0.5-2.5 (3) MG/3ML SOLN Take 3 mLs by nebulization every 6 (six) hours as needed.  . levETIRAcetam (KEPPRA) 1000 MG tablet Take 1,000 mg by mouth 2 (two) times daily.  Marland Kitchen LORazepam (ATIVAN) 2 MG/ML concentrated solution Take 1 mg by mouth every 8 (eight) hours. As needed for seizure activity  . Menthol, Topical Analgesic, (BIOFREEZE) 4 % GEL Apply 1 application topically 2 (two) times daily. Apply to left shoulder and elbow for arthritis.  . metoprolol succinate (TOPROL-XL) 50 MG 24 hr tablet Take 50 mg by mouth daily. Take with or immediately following a meal.  . Multiple Vitamin (MULTIVITAMIN) LIQD Take 5 mLs by mouth daily.  Marland Kitchen  Nutritional Supplements (FEEDING SUPPLEMENT, JEVITY 1.2 CAL,) LIQD Place 1,000 mLs into feeding tube continuous. 107ml per hour  . omeprazole (PRILOSEC) 2 mg/mL SUSP Take 40 mg by mouth every 12 (twelve) hours.   . rivaroxaban (XARELTO) 20 MG TABS tablet Take 20 mg by mouth daily with supper.  . sertraline (ZOLOFT) 20 MG/ML concentrated solution Take by mouth daily.  . Water For Irrigation, Sterile (FREE WATER) SOLN Place 100 mLs into feeding tube every 8 (eight) hours.   No facility-administered encounter medications on file as of 05/09/2019.    PHYSICAL EXAM:   General: chroncially ill, debilitated, cognitively and functionally impaired male Cardiovascular: regular rate and rhythm Pulmonary: clear ant fields Abdomen: soft, nontender, + bowel sounds/g-tube Extremities: mild BLE edema, no joint deformities Neurological: functionally quadriplegic  Justin Roen Prince [place], NP

## 2019-05-13 IMAGING — CT CT HEAD W/O CM
2 of 6 series · 14 of 47 positions shown, 17 images · non-contrast
Comparison: 09/25/2015

CLINICAL DATA: Worsening of mental status. Fever. Tachycardia.
Rapid breathing.

EXAM:
CT HEAD WITHOUT CONTRAST
TECHNIQUE: Contiguous axial images were obtained from the base of the skull
through the vertex without intravenous contrast.

[Series 2: head wo · axial · 0.42mm/px · z∈[+1214,+1334]mm · 11 of 28 slices shown, 14 images]
[im 2/28  brain]
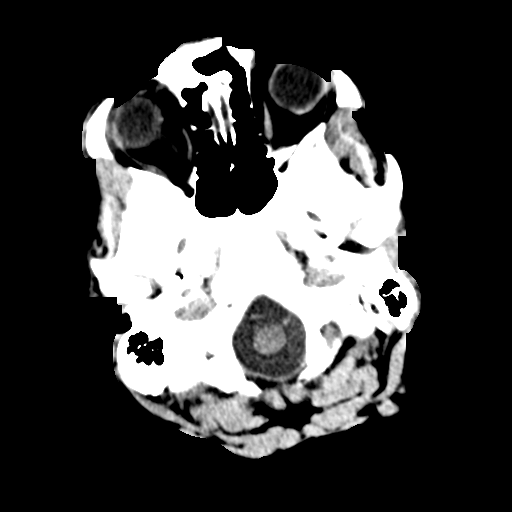
[im 2/28  bone]
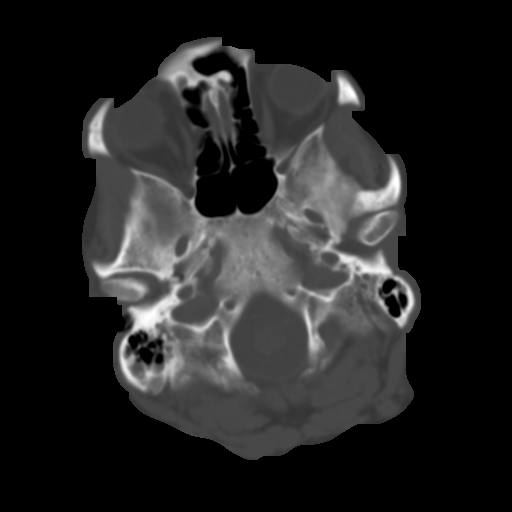
[im 5/28  brain]
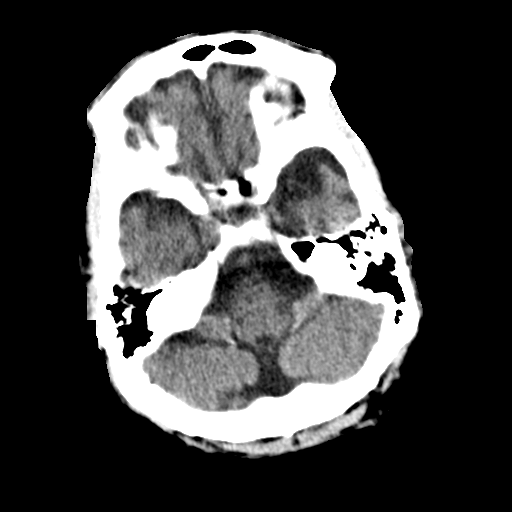
[im 7/28  brain]
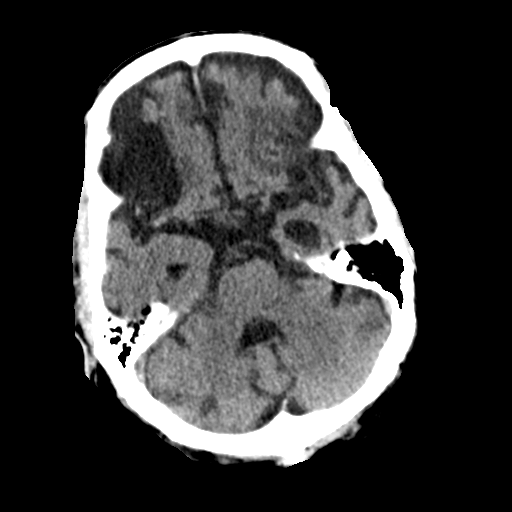
[im 10/28  brain]
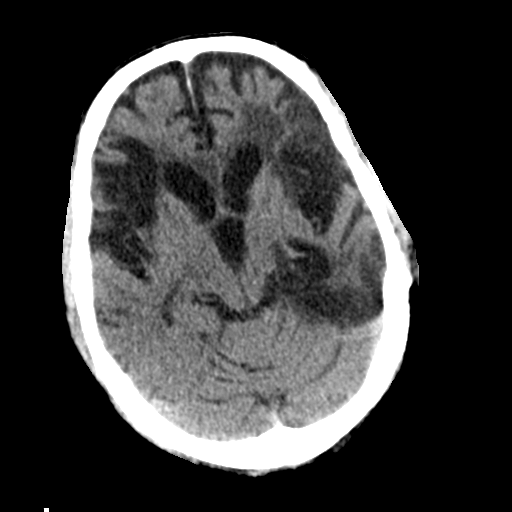
[im 12/28  brain]
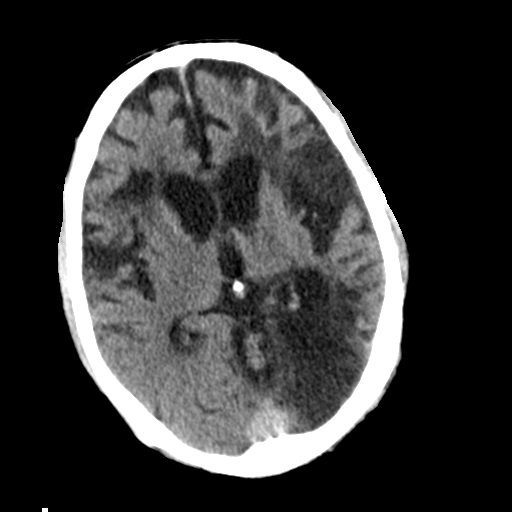
[im 12/28  bone]
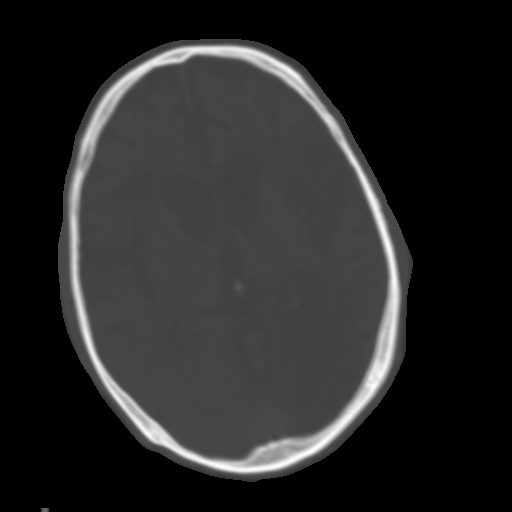
[im 15/28  brain]
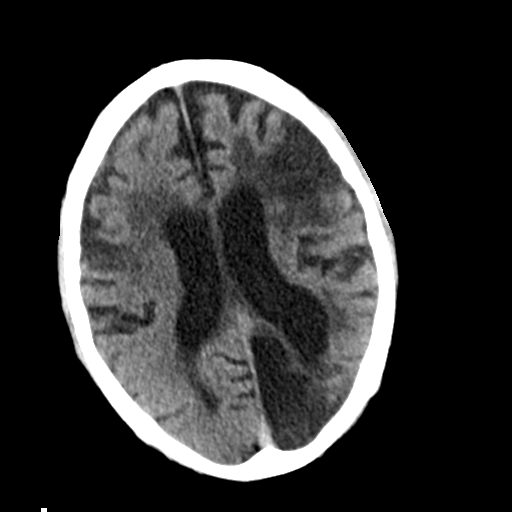
[im 16/28  brain]
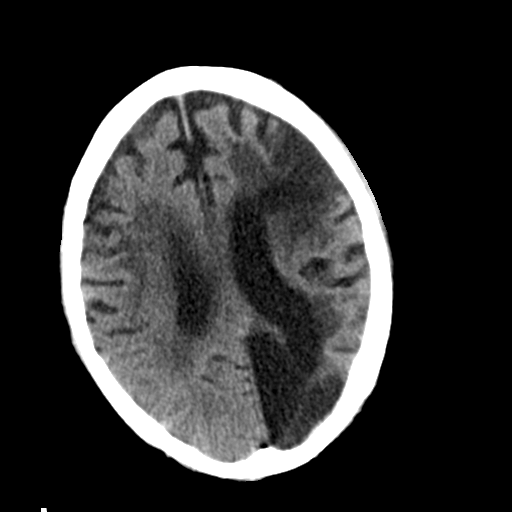
[im 18/28  brain]
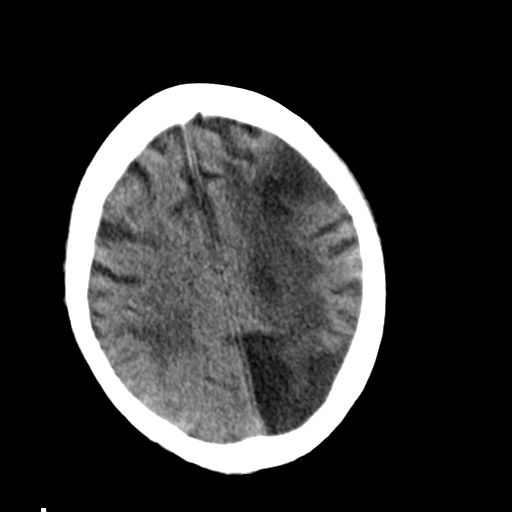
[im 21/28  brain]
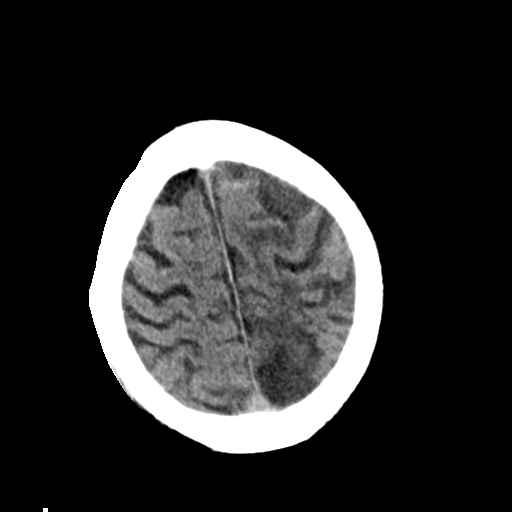
[im 21/28  bone]
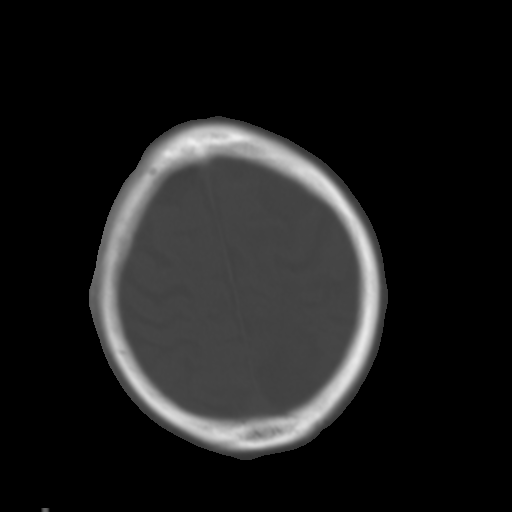
[im 23/28  brain]
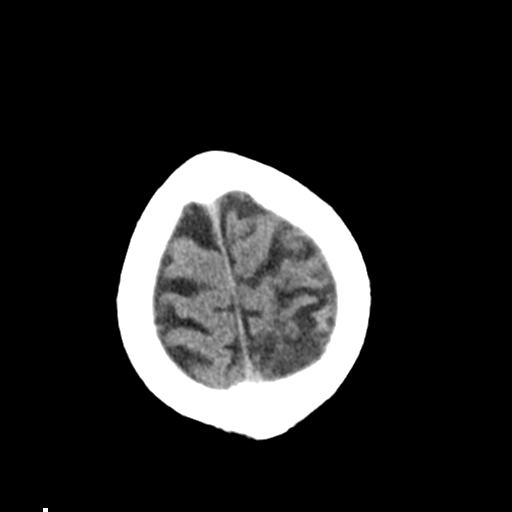
[im 26/28  brain]
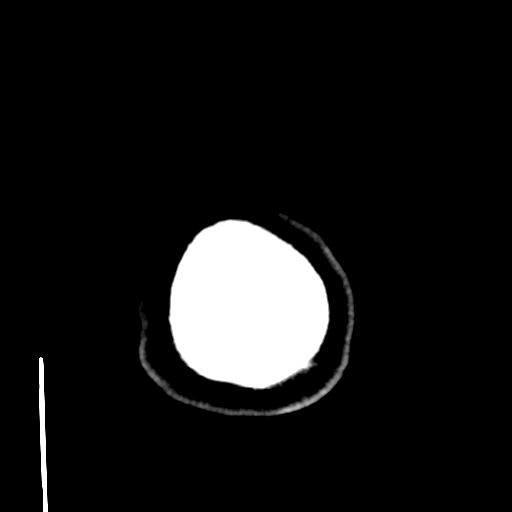

[Series 8: coronal soft tissue · coronal · 0.20mm/px · 3 of 64 slices shown]
[im 16/64  brain]
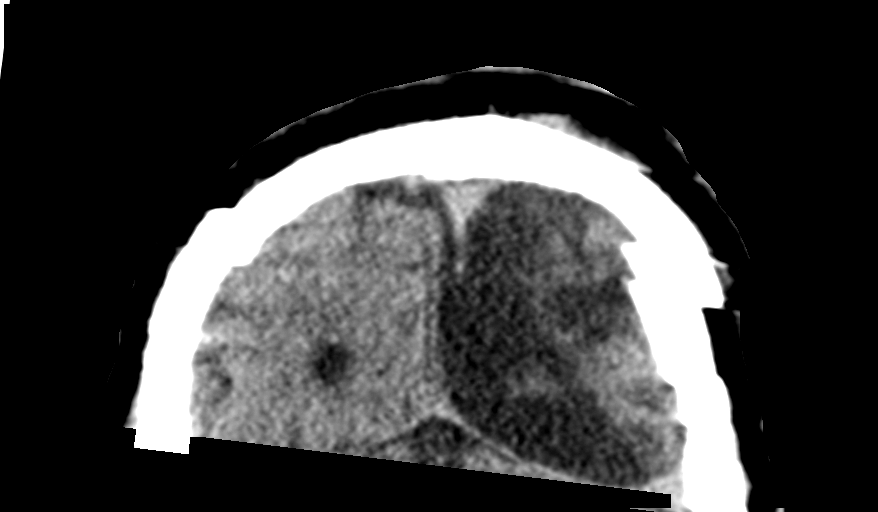
[im 32/64  brain]
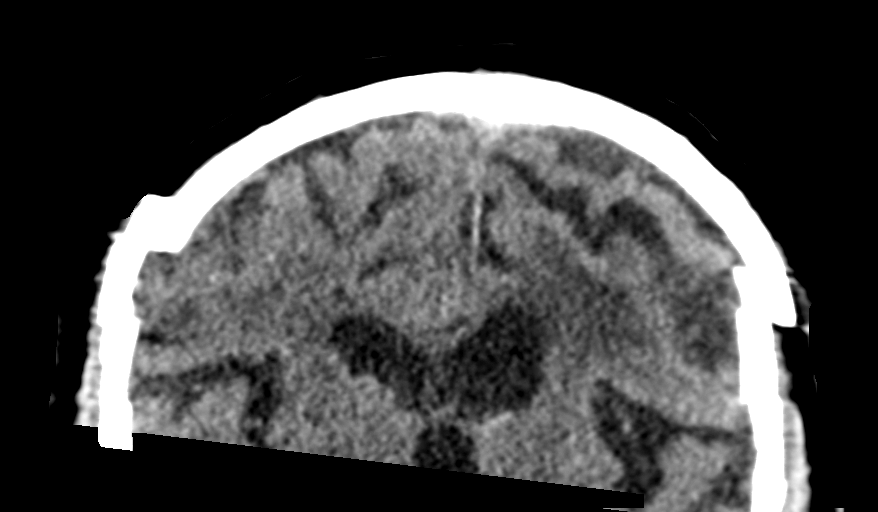
[im 48/64  brain]
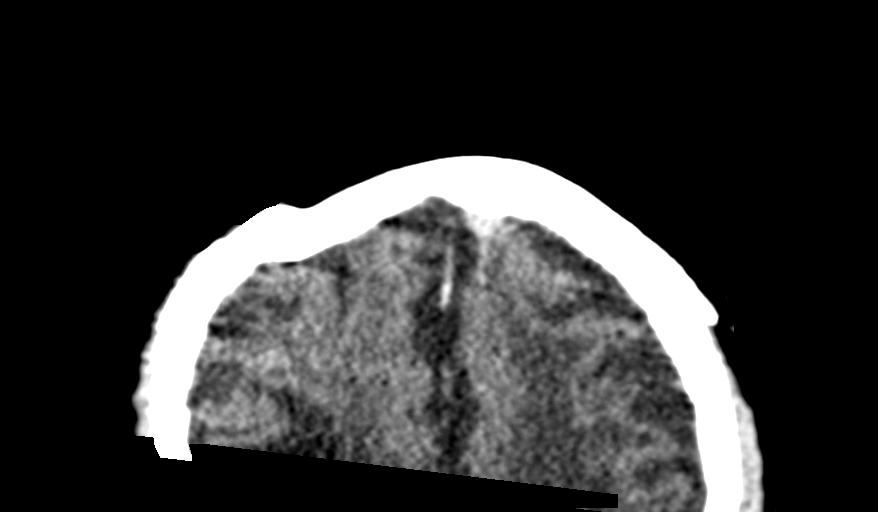

[14 of 47 positions shown; findings below may reference images not displayed]

FINDINGS: Brain: Generalized brain atrophy. Extensive old ischemic changes.
Old right cerebellar infarction. Old right frontal and insular
infarction. Old right frontoparietal vertex infarction. Old left
frontal and insular infarction. Old left temporal infarction. Old
left occipital infarction. Old left parietal and posterior frontal
infarction. No evidence of hemorrhage, hydrocephalus or extra-axial
collection.

Vascular: No abnormal vascular finding.

Skull: Negative

Sinuses/Orbits: Sinuses are clear. Old right medial orbital blowout
fracture. No acute orbital finding.

Other: None
IMPRESSION: No acute finding by CT extensive old brain infarctions scattered
throughout as outlined above.

## 2019-05-28 ENCOUNTER — Encounter: Payer: Self-pay | Admitting: Nurse Practitioner

## 2019-05-28 ENCOUNTER — Non-Acute Institutional Stay: Payer: Medicare Other | Admitting: Nurse Practitioner

## 2019-05-28 VITALS — BP 109/57 | HR 70 | Temp 97.2°F | Resp 18

## 2019-05-28 DIAGNOSIS — I639 Cerebral infarction, unspecified: Secondary | ICD-10-CM

## 2019-05-28 DIAGNOSIS — Z515 Encounter for palliative care: Secondary | ICD-10-CM

## 2019-05-28 NOTE — Progress Notes (Signed)
[facility]rap[facility], [facility] Palliative Care Consult Note Telephone: [phone]  Fax: [phone]  PATIENT NAME: Justin [name] DOB: July 04, 1950 MRN: [id number]  PRIMARY CARE PROVIDER:   Dimple Casey, MD PRIMARY CARE PROVIDER:Dr Justin [name] PROVIDER:Dr Hodges/[facility] RESPONSIBLE PARTY:Justin [name] sister [phone]  RECOMMENDATIONS and PLAN: 1.ACP: medical goals include DNR, do not intubate, do not hospitalize, new blood transfusions, new surgical interventions but wishes are for antibiotics, diagnostic testing, feeding tube, IV hydration, lab testing  2.Memory loss secondary tolate onset CVA. Continue with supportive measures as chronic disease remains Progressive.  3.Palliative care encounter; Ongoing monitoring chronic disease management, symptoms, goc with emotional support.  I spent 35 minutes providing this consultation,  from 3:35pm to 4:10pm. More than 50% of the time in this consultation was spent coordinating communication.   HISTORY OF PRESENT ILLNESS:  Justin [name] is a 69 y.o. year old male with multiple medical problems including CVA with left-sided weakness, dysphasia including G tube for tube feedings essential to sustain life, atrial fibrillation, hypertension, hyperlipidemia, dysarthria.   [name] [facility]. [name] remains bed-bound, requires that have to turn in position, prop with pillows. [name] is total ADL dependents with incontinence. [name] is bed tube feeding through a gastrostomy tube essential to sustain life. [name] is cognitively impaired. [name] does on occasion say a word or two but most of the time is nonverbal. No recent falls, hospitalizations. At present [name] is lying in bed. He appears debilitated, cognitively impaired but comfortable. No visitors present. I visited and observed [name]. [name] had his  eyes closed but opened his eyes to verbal cues. [name] was cooperative but the assessment. [name] currently appears comfortable and stable. Will continue to follow with palliative care with next visit in one month if needed or sooner should he declined. I have attempted to call Justin [name], Mr Newhart's sister Healthcare power of attorney, message left. I updated nursing staff new new changes to current goals or plan of care. Medical goals Revisited and continue straight what is treatable in DNR in place.  12/26/2018 weight 180 lbs 12 / 16 / 2020 weight 176.7 lbs 1 / 5 / 2021 weight 174.4 lbs BMI 26.5  Palliative Care was asked to help to continue to address goals of care.   CODE STATUS:  DNR  PPS: 30% HOSPICE ELIGIBILITY/DIAGNOSIS: TBD  PAST MEDICAL HISTORY:  Past Medical History:  Diagnosis Date  . Atrial fibrillation ([facility])   . Hyperlipidemia   . Hypertension   . Seizure ([facility])    ON KEPPRA  . Stroke [facility])     SOCIAL HX:  Social History   Tobacco Use  . Smoking status: Never Smoker  . Smokeless tobacco: Never Used  Substance Use Topics  . Alcohol use: No    ALLERGIES: No Known Allergies   PERTINENT MEDICATIONS:  Outpatient Encounter Medications as of 05/28/2019  Medication Sig  . cholecalciferol (VITAMIN D3) 25 MCG (1000 UT) tablet Take 1,000 Units by mouth daily.  Marland Kitchen diltiazem (CARDIZEM) 60 MG tablet Take 1 tablet (60 mg total) by mouth every 8 (eight) hours.  Marland Kitchen ipratropium-albuterol (DUONEB) 0.5-2.5 (3) MG/3ML SOLN Take 3 mLs by nebulization every 6 (six) hours as needed.  . levETIRAcetam (KEPPRA) 1000 MG tablet Take 1,000 mg by mouth 2 (two) times daily.  Marland Kitchen LORazepam (ATIVAN) 2 MG/ML concentrated solution Take 1 mg by mouth every  8 (eight) hours. As needed for seizure activity  . Menthol, Topical Analgesic, (BIOFREEZE) 4 % GEL Apply 1 application topically 2 (two) times daily. Apply to left shoulder and elbow for arthritis.  . metoprolol succinate (TOPROL-XL) 50 MG 24 hr  tablet Take 50 mg by mouth daily. Take with or immediately following a meal.  . Multiple Vitamin (MULTIVITAMIN) LIQD Take 5 mLs by mouth daily.  . Nutritional Supplements (FEEDING SUPPLEMENT, JEVITY 1.2 CAL,) LIQD Place 1,000 mLs into feeding tube continuous. 23ml per hour  . omeprazole (PRILOSEC) 2 mg/mL SUSP Take 40 mg by mouth every 12 (twelve) hours.  . rivaroxaban (XARELTO) 20 MG TABS tablet Take 20 mg by mouth daily with supper.  . sertraline (ZOLOFT) 20 MG/ML concentrated solution Take by mouth daily.  . Water For Irrigation, Sterile (FREE WATER) SOLN Place 100 mLs into feeding tube every 8 (eight) hours.   No facility-administered encounter medications on file as of 05/28/2019.    PHYSICAL EXAM:   General: debilitated, chronically ill, cognitively impaired male Cardiovascular: regular rate and rhythm Pulmonary: clear ant fields Abdomen: soft, nontender, + bowel sounds/g-tube Extremities: BLE edema, no joint deformities Neurological: functional quadriplegic  Justin [name] Justin Gully, NP

## 2019-05-30 ENCOUNTER — Other Ambulatory Visit: Payer: Self-pay

## 2019-06-23 DEATH — deceased
